# Patient Record
Sex: Female | Born: 1975
Health system: Southern US, Community
[De-identification: ages and names within clinical notes are randomized; demographics above are authoritative.]

## PROBLEM LIST (undated history)

## (undated) DIAGNOSIS — L709 Acne, unspecified: Secondary | ICD-10-CM

## (undated) DIAGNOSIS — E785 Hyperlipidemia, unspecified: Secondary | ICD-10-CM

## (undated) DIAGNOSIS — N926 Irregular menstruation, unspecified: Secondary | ICD-10-CM

## (undated) DIAGNOSIS — Z87448 Personal history of other diseases of urinary system: Secondary | ICD-10-CM

## (undated) DIAGNOSIS — Z8619 Personal history of other infectious and parasitic diseases: Secondary | ICD-10-CM

## (undated) DIAGNOSIS — N10 Acute tubulo-interstitial nephritis: Secondary | ICD-10-CM

## (undated) DIAGNOSIS — N390 Urinary tract infection, site not specified: Secondary | ICD-10-CM

## (undated) HISTORY — DX: Hyperlipidemia, unspecified: E78.5

## (undated) HISTORY — PX: EYE SURGERY: SHX253

## (undated) HISTORY — DX: Personal history of other infectious and parasitic diseases: Z86.19

## (undated) HISTORY — DX: Acute pyelonephritis: N10

## (undated) HISTORY — DX: Urinary tract infection, site not specified: N39.0

## (undated) HISTORY — DX: Personal history of other diseases of urinary system: Z87.448

## (undated) HISTORY — DX: Irregular menstruation, unspecified: N92.6

## (undated) HISTORY — DX: Acne, unspecified: L70.9

---
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
## 2009-06-10 ENCOUNTER — Ambulatory Visit: Payer: Self-pay | Admitting: Urology

## 2010-08-30 HISTORY — PX: TUBAL LIGATION: SHX77

## 2010-09-24 IMAGING — US US RENAL KIDNEY
1 series · 17 of 24 positions shown · non-contrast
Comparison: none

REASON FOR EXAM: UTI AND MICROSCOPIC HEMATURIA
COMMENTS:

[Series 1: us renal kidney · 17 of 24 slices shown]
[im 1/24]
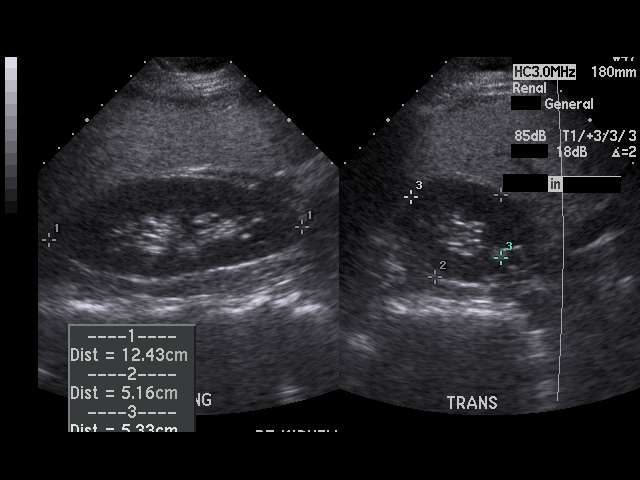
[im 3/24]
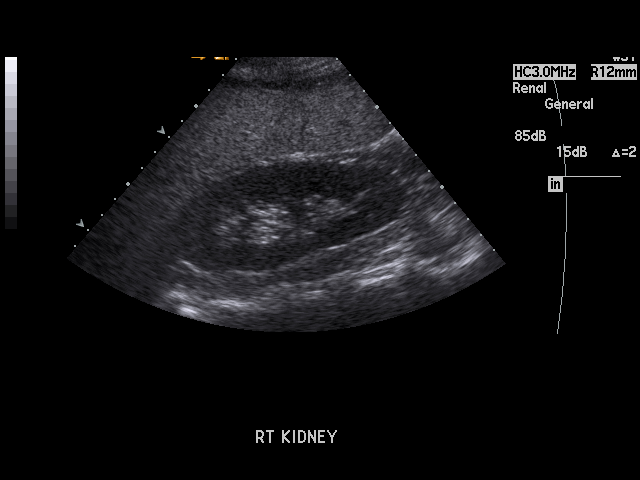
[im 4/24]
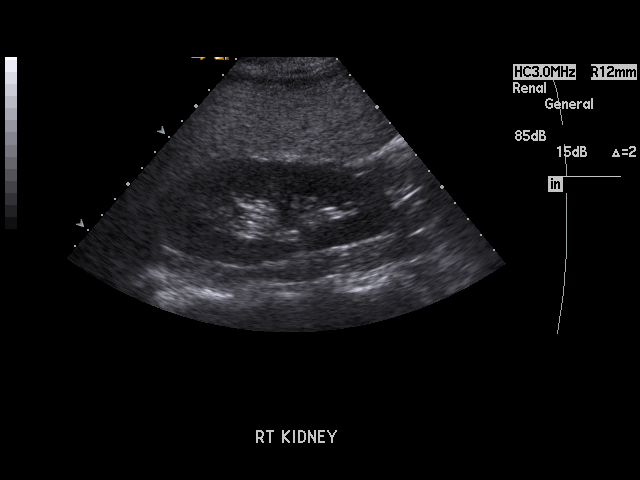
[im 5/24]
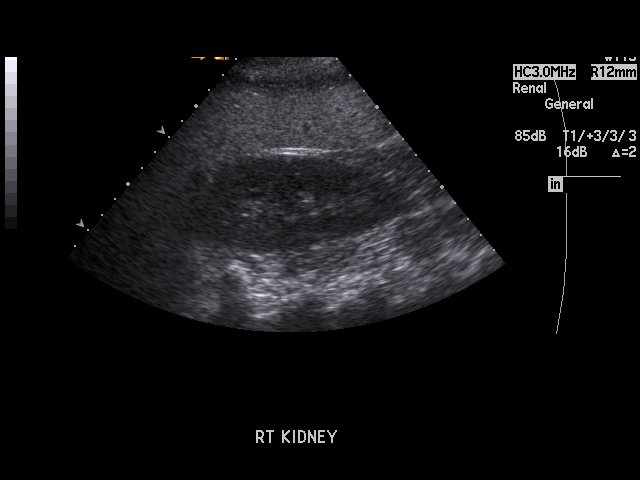
[im 7/24]
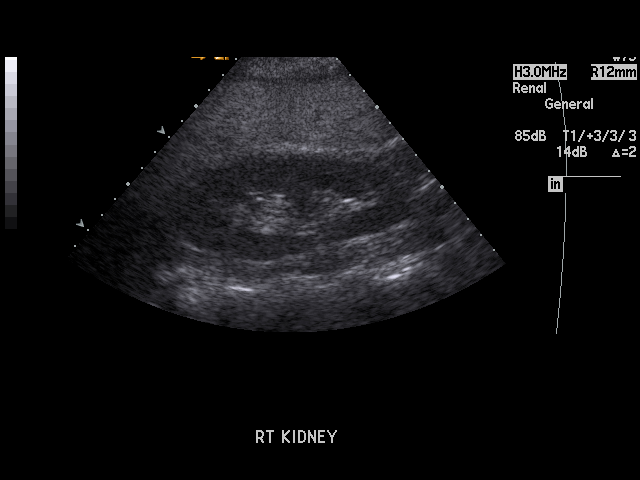
[im 8/24]
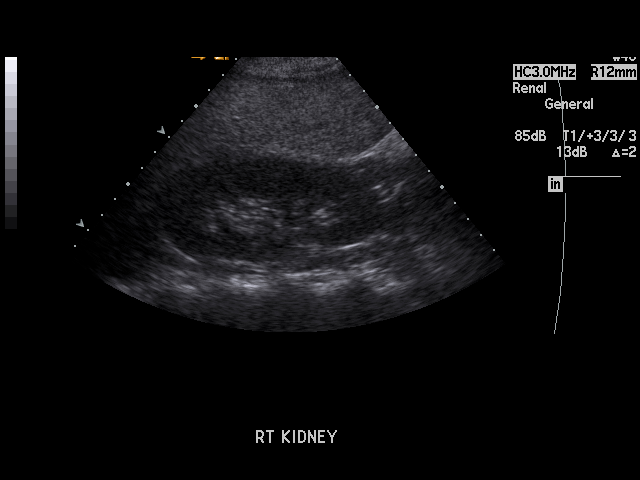
[im 10/24]
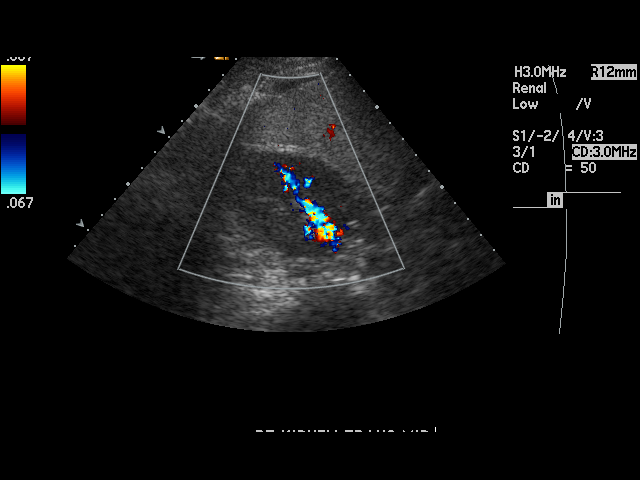
[im 11/24]
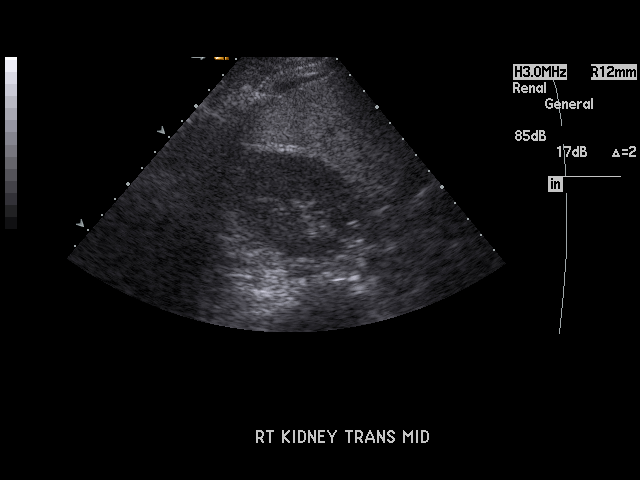
[im 13/24]
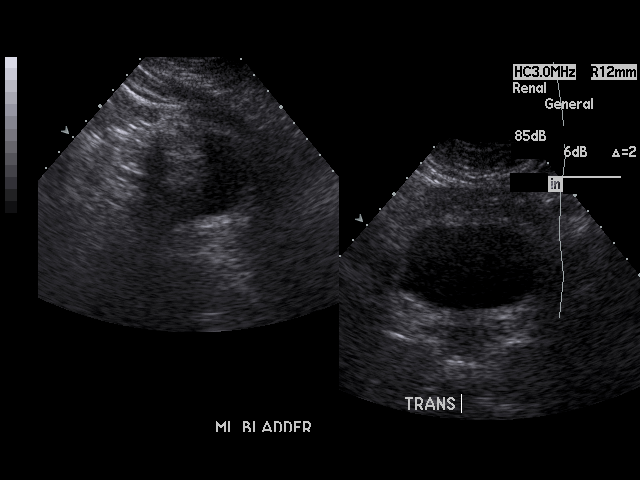
[im 14/24]
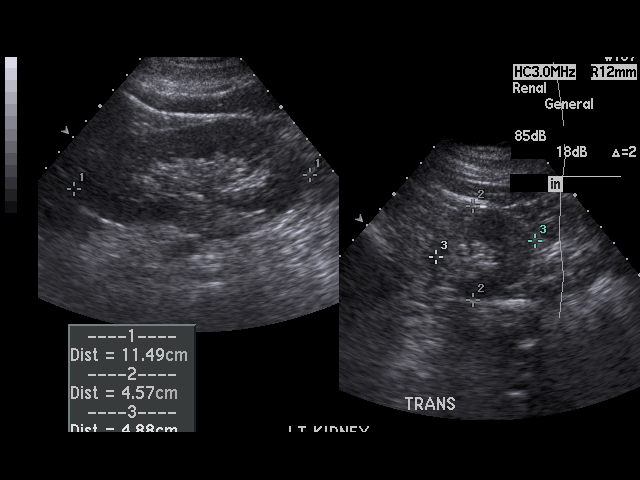
[im 15/24]
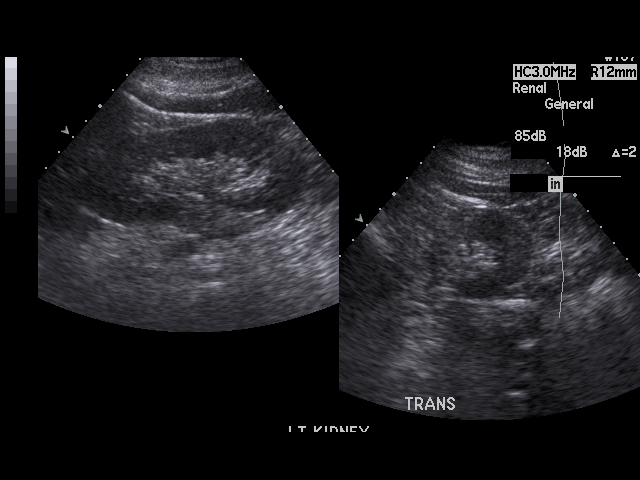
[im 17/24]
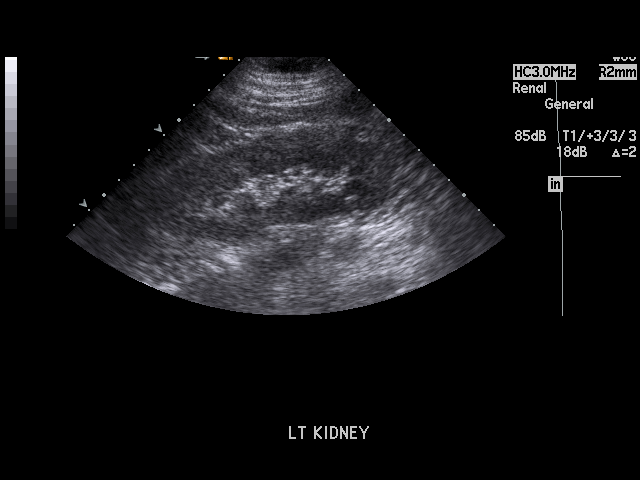
[im 18/24]
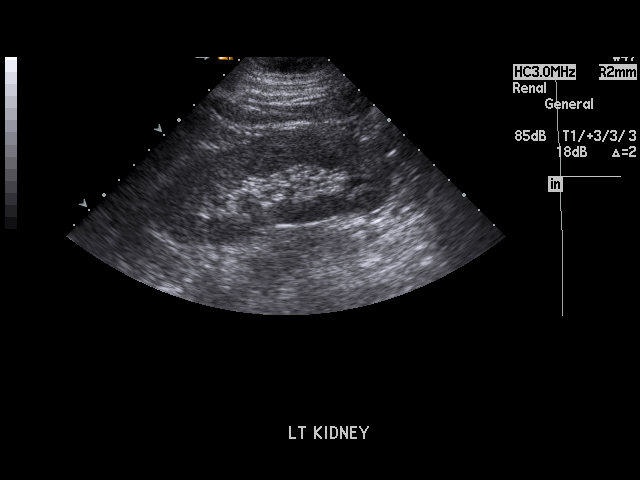
[im 20/24]
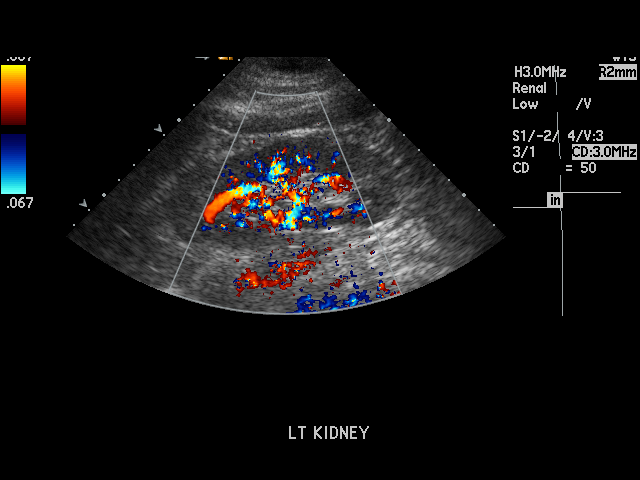
[im 21/24]
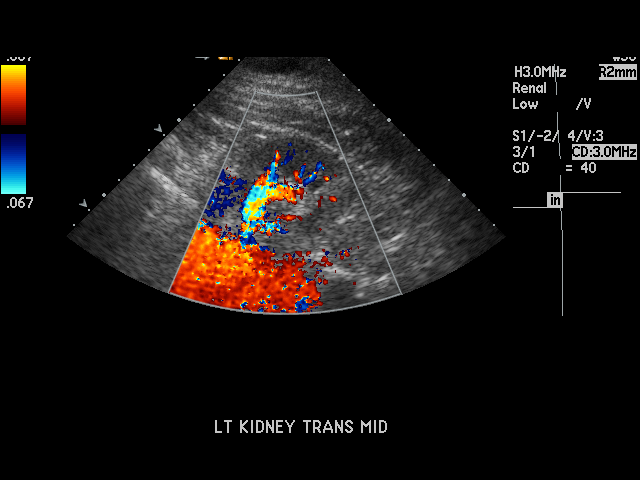
[im 22/24]
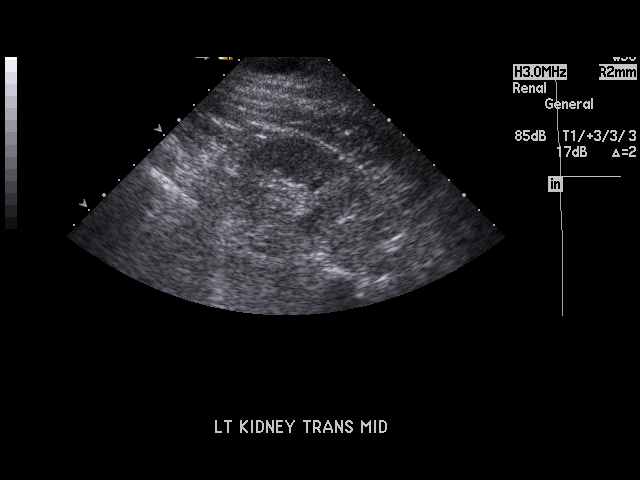
[im 24/24]
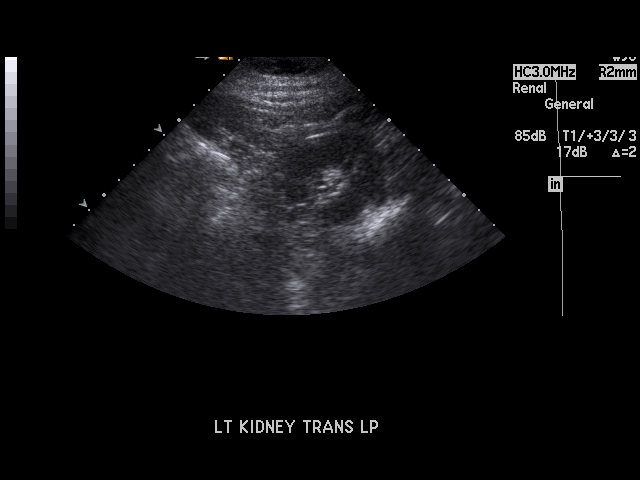

[17 of 24 positions shown; findings below may reference images not displayed]

PROCEDURE:     US  - US KIDNEY  - June 10, 2009  [DATE]

RESULT:     The right kidney measures 12.4 x 5.2 x 5.3 cm. The left kidney
measures 11.5 by 4.6 x 4.9 cm. There is no evidence of hydronephrosis nor
perinephric fluid collections. No calcified stones are identified. There is
no evidence of a cystic nor solid mass. The echotexture the renal cortex on
the right remains lower than that of the adjacent liver which is normal.
Survey views of the urinary bladder are normal.
IMPRESSION: Normal renal ultrasound examination.

## 2012-07-19 ENCOUNTER — Encounter: Payer: Self-pay | Admitting: Family Medicine

## 2012-07-19 ENCOUNTER — Ambulatory Visit (INDEPENDENT_AMBULATORY_CARE_PROVIDER_SITE_OTHER): Payer: BC Managed Care – PPO | Admitting: Family Medicine

## 2012-07-19 VITALS — BP 126/78 | HR 72 | Temp 98.2°F | Ht 63.0 in | Wt 180.5 lb

## 2012-07-19 DIAGNOSIS — L709 Acne, unspecified: Secondary | ICD-10-CM | POA: Insufficient documentation

## 2012-07-19 DIAGNOSIS — N926 Irregular menstruation, unspecified: Secondary | ICD-10-CM

## 2012-07-19 DIAGNOSIS — L708 Other acne: Secondary | ICD-10-CM

## 2012-07-19 DIAGNOSIS — Z0000 Encounter for general adult medical examination without abnormal findings: Secondary | ICD-10-CM | POA: Insufficient documentation

## 2012-07-19 DIAGNOSIS — Z23 Encounter for immunization: Secondary | ICD-10-CM

## 2012-07-19 MED ORDER — CLINDAMYCIN PHOS-BENZOYL PEROX 1-5 % EX GEL: Freq: Every day | CUTANEOUS | Status: DC

## 2012-07-19 NOTE — Patient Instructions (Addendum)
Get blood work done at [facility].  Equities trader provided today. Flu shot today. Check with OBGYN about Tdap (tetanus and pertussis) during pregnancy and let me know date so I can update your chart.  If not done, return for Tdap here. Return in 1 year or as needed.

## 2012-07-19 NOTE — Assessment & Plan Note (Signed)
Preventative protocols reviewed and updated unless pt declined. Discussed healthy diet and lifestyle. Return next year for well woman as well as CPE. Check FLP, BMP and TSH today.

## 2012-07-19 NOTE — Assessment & Plan Note (Signed)
Longstanding. Used reproductive endocrinologist for fertility last pregnancy. Check TSH today.

## 2012-07-19 NOTE — Progress Notes (Signed)
Subjective:    Patient ID: Rachel Bailey, female    DOB: [birth year], 36 y.o.   MRN: [id number]  HPI CC: new pt to establish  Has 34 yo twins.  Would like CPE today.  Dx with flu this past year.  H/o acne - has ben prescribed clindamycin and benzoyl peroxide and working well  Prior saw [facility].  LMP 11/10/2011.  Gets infrequent periods.  S/p BTL 2012.  Went to reproductive endocrinologist for latest pregnancy (twins)  CPE:  Last pap smear 03/2012 - normal. Would like labwork at [facility]. Flu shot - today. Last well woman 03/2012 with OBGYN but would like to establish here in future.  Seat belt use discussed Sunscreen use discussed.  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  Past histories reviewed and updated if relevant as below. There is no problem list on file for this patient.  Past Medical History  Diagnosis Date  . History of chicken pox    Past Surgical History  Procedure Date  . Dilation and curettage of uterus 1995    due to miscarriage  . Tubal ligation 2012   History  Substance Use Topics  . Smoking status: Never Smoker   . Smokeless tobacco: Never Used  . Alcohol Use: No   Family History  Problem Relation Age of Onset  . Hyperlipidemia Mother   . Hypertension Mother   . Hyperlipidemia Father   . Diabetes Paternal Grandmother   . Stroke Maternal Grandfather     x2  . Cancer Neg Hx   . CAD Neg Hx    Allergies  Allergen Reactions  . Ciprofloxacin Other (See Comments)    Headaches, bodyaches   No current outpatient prescriptions on file prior to visit.    Review of Systems  Constitutional: Negative for fever, chills, activity change, appetite change, fatigue and unexpected weight change.  HENT: Negative  for hearing loss and neck pain.   Eyes: Negative for visual disturbance.  Respiratory: Negative for cough, chest tightness, shortness of breath and wheezing.   Cardiovascular: Negative for chest pain, palpitations and leg swelling.  Gastrointestinal: Negative for nausea, vomiting, abdominal pain, diarrhea, constipation, blood in stool and abdominal distention.  Genitourinary: Negative for hematuria and difficulty urinating.  Musculoskeletal: Negative for myalgias and arthralgias.  Skin: Negative for rash.  Neurological: Negative for dizziness, seizures, syncope and headaches.  Hematological: Does not bruise/bleed easily.  Psychiatric/Behavioral: Negative for dysphoric mood. The patient is not nervous/anxious.        Objective:   Physical Exam  Nursing note and vitals reviewed. Constitutional: She is oriented to person, place, and time. She appears well-developed and well-nourished. No distress.  HENT:  Head: Normocephalic and atraumatic.  Right Ear: Tympanic membrane, external ear and ear canal normal.  Left Ear: Hearing, tympanic membrane, external ear and ear canal normal.  Nose: Nose normal.  Mouth/Throat: Oropharynx is clear and moist. No oropharyngeal exudate.  Eyes: Conjunctivae normal and EOM are normal. Pupils are equal, round, and reactive to light. No scleral icterus.  Neck: Normal range of motion. Neck supple. No thyromegaly present.  Cardiovascular: Normal rate, regular rhythm, normal heart sounds and intact distal pulses.   No murmur heard. Pulses:      Radial pulses are 2+ on the right side, and 2+ on the  left side.  Pulmonary/Chest: Effort normal and breath sounds normal. No respiratory distress. She has no wheezes. She has no rales.  Abdominal: Soft. Bowel sounds are normal. She exhibits no distension and no mass. There is no tenderness. There is no rebound and no guarding.  Musculoskeletal: Normal range of motion. She exhibits no edema.  Lymphadenopathy:    She has  no cervical adenopathy.  Neurological: She is alert and oriented to person, place, and time.       CN grossly intact, station and gait intact  Skin: Skin is warm and dry. No rash noted.  Psychiatric: She has a normal mood and affect. Her behavior is normal. Judgment and thought content normal.       Assessment & Plan:

## 2012-07-19 NOTE — Assessment & Plan Note (Signed)
Refilled benzaclin per pt preference as working well for her.

## 2013-07-05 ENCOUNTER — Other Ambulatory Visit: Payer: Self-pay

## 2013-10-16 ENCOUNTER — Encounter: Payer: BC Managed Care – PPO | Admitting: Family Medicine

## 2013-11-12 ENCOUNTER — Encounter: Payer: Self-pay | Admitting: Family Medicine

## 2013-11-12 ENCOUNTER — Ambulatory Visit (INDEPENDENT_AMBULATORY_CARE_PROVIDER_SITE_OTHER): Payer: BC Managed Care – PPO | Admitting: Family Medicine

## 2013-11-12 VITALS — BP 118/76 | HR 64 | Temp 98.3°F | Ht 63.0 in | Wt 183.1 lb

## 2013-11-12 DIAGNOSIS — E66811 Obesity, class 1: Secondary | ICD-10-CM | POA: Insufficient documentation

## 2013-11-12 DIAGNOSIS — Z0000 Encounter for general adult medical examination without abnormal findings: Secondary | ICD-10-CM

## 2013-11-12 DIAGNOSIS — R209 Unspecified disturbances of skin sensation: Secondary | ICD-10-CM

## 2013-11-12 DIAGNOSIS — R200 Anesthesia of skin: Secondary | ICD-10-CM | POA: Insufficient documentation

## 2013-11-12 DIAGNOSIS — Z23 Encounter for immunization: Secondary | ICD-10-CM

## 2013-11-12 DIAGNOSIS — E669 Obesity, unspecified: Secondary | ICD-10-CM

## 2013-11-12 NOTE — Assessment & Plan Note (Addendum)
Preventative protocols reviewed and updated unless pt declined. Discussed healthy diet and lifestyle. Discussed pap smear - will be due 2016 as all normal periods in past and no fmhx cervical cancer. Discussed breast exam - discussed self breast exams and reasons to seek further care.

## 2013-11-12 NOTE — Patient Instructions (Addendum)
Tdap today. Blood work Equities traderscript provided today. Good to see you today, call us with questions. Return in 1 year or as needed for next physical.

## 2013-11-12 NOTE — Assessment & Plan Note (Signed)
Discussed incorporating regular exercise into routine.

## 2013-11-12 NOTE — Assessment & Plan Note (Signed)
With intermittent L arm/neck pain, but exam reassuring today. Possible cervical neuropathy - recommended NSAID use. If red flags worsening numbness pt to contact me for further evaluation. Provided with neck stretching exercises.

## 2013-11-12 NOTE — Progress Notes (Signed)
BP 118/76  Pulse 64  Temp(Src) 98.3 F (36.8 C) (Oral)  Ht 5\' 3"  (1.6 m)  Wt 183 lb 1.9 oz (83.063 kg)  BMI 32.45 kg/m2   CC: annual exam  Subjective:    Patient ID: Rachel Bailey, female    DOB: 10/01/1975, 38 y.o.   MRN: [id number]  HPI: Rachel Bailey is a 38 y.o. female presenting on 11/12/2013 for Annual Exam   For last 6 months noticing tingling in L shoulder - starts posterior shoulder and travels anteriorly and occasionally down arm.  Occasional neck pain.  Has seen chiropractor in past which helped.  No weakness.  Has tried motrin in past which helped.  Right handed but does carry twins on left side.  Denies inciting trauma/falls.  LMP 11/10/2011. Gets infrequent periods.  Went to reproductive endocrinologist for 2012 twin pregnancy. S/p BTL 2012.   Seat belt use discussed  Sunscreen use discussed. No changing spots on skin Body mass index is 32.45 kg/(m^2). Wt Readings from Last 3 Encounters:  11/12/13 183 lb 1.9 oz (83.063 kg)  07/19/12 180 lb 8 oz (81.874 kg)    Preventative: LMP 11/2012 - normal. Always has been irregular.  Has had 3 periods since twins (2012).  Heavy bleeding for 1 week at a time.   Self breast exams at home, no concerns. Last well woman 03/2012 with OBGYN but would like to establish here in future. Flu - 06/2012. Tetanus - unsure.  Requests today. Tdap 10/2013  Caffeine: 2 cups coffee/day  Lives with husband, son (2005), 2 twins (2012), outdoor Medical laboratory scientific officercat  Occupation: Visual merchandiserDI Analyst at [employer]  Edu: BS in business administration  Activity: no regular exercise routine Diet: good water, fruits/vegetables daily   Relevant past medical, surgical, family and social history reviewed and updated as indicated.  Allergies and medications reviewed and updated. Current Outpatient Prescriptions on File Prior to Visit  Medication Sig  . clindamycin-benzoyl peroxide (BENZACLIN) gel Apply topically daily. AAA   No current facility-administered medications on file prior to  visit.    Review of Systems  Constitutional: Negative for fever, chills, activity change, appetite change, fatigue and unexpected weight change.  HENT: Negative for hearing loss.   Eyes: Negative for visual disturbance.  Respiratory: Negative for cough, chest tightness, shortness of breath and wheezing.   Cardiovascular: Negative for chest pain, palpitations and leg swelling.  Gastrointestinal: Negative for nausea, vomiting, abdominal pain, diarrhea, constipation, blood in stool and abdominal distention.  Genitourinary: Negative for hematuria and difficulty urinating. Vaginal bleeding: irregular bleed.  Musculoskeletal: Negative for arthralgias, myalgias and neck pain.  Skin: Negative for rash.  Neurological: Negative for dizziness, seizures, syncope and headaches.  Hematological: Negative for adenopathy. Does not bruise/bleed easily.  Psychiatric/Behavioral: Negative for dysphoric mood. The patient is not nervous/anxious.    Per HPI unless specifically indicated above    Objective:    BP 118/76  Pulse 64  Temp(Src) 98.3 F (36.8 C) (Oral)  Ht 5\' 3"  (1.6 m)  Wt 183 lb 1.9 oz (83.063 kg)  BMI 32.45 kg/m2  Physical Exam  Nursing note and vitals reviewed. Constitutional: She is oriented to person, place, and time. She appears well-developed and well-nourished. No distress.  HENT:  Head: Normocephalic and atraumatic.  Right Ear: Hearing, tympanic membrane, external ear and ear canal normal.  Left Ear: Hearing, tympanic membrane, external ear and ear canal normal.  Nose: Nose normal.  Mouth/Throat: Uvula is midline, oropharynx is clear and moist and mucous membranes are normal. No oropharyngeal  exudate, posterior oropharyngeal edema or posterior oropharyngeal erythema.  Eyes: Conjunctivae and EOM are normal. Pupils are equal, round, and reactive to light. No scleral icterus.  Neck: Normal range of motion. Neck supple. No thyromegaly present.  Cardiovascular: Normal rate, regular  rhythm, normal heart sounds and intact distal pulses.   No murmur heard. Pulses:      Radial pulses are 2+ on the right side, and 2+ on the left side.  Pulmonary/Chest: Effort normal and breath sounds normal. No respiratory distress. She has no wheezes. She has no rales.  Abdominal: Soft. Bowel sounds are normal. She exhibits no distension and no mass. There is no tenderness. There is no rebound and no guarding.  Genitourinary:  Deferred - due next year  Musculoskeletal: Normal range of motion. She exhibits no edema.  No midline spine tenderness or shoulder tenderness. FROM at shoulder and neck Strength intact with SITS testing against resistance  Lymphadenopathy:    She has no cervical adenopathy.  Neurological: She is alert and oriented to person, place, and time.  CN grossly intact, station and gait intact Neg spurling 5/5 strength BUE  Skin: Skin is warm and dry. No rash noted.  Psychiatric: She has a normal mood and affect. Her behavior is normal. Judgment and thought content normal.   No results found for this or any previous visit.    Assessment & Plan:   Problem List Items Addressed This Visit   Healthcare maintenance - Primary     Preventative protocols reviewed and updated unless pt declined. Discussed healthy diet and lifestyle. Discussed pap smear - will be due 2016 as all normal periods in past and no fmhx cervical cancer. Discussed breast exam - discussed self breast exams and reasons to seek further care.    Relevant Orders      Tdap vaccine greater than or equal to 7yo IM (Completed)   Left arm numbness     With intermittent L arm/neck pain, but exam reassuring today. Possible cervical neuropathy - recommended NSAID use. If red flags worsening numbness pt to contact me for further evaluation. Provided with neck stretching exercises.    Obesity     Discussed incorporating regular exercise into routine.     Other Visit Diagnoses   Immunization due         Relevant Orders       Tdap vaccine greater than or equal to 7yo IM (Completed)        Follow up plan: Return in about 4 weeks (around 12/10/2013), or as needed, for annual exam, prior fasting for blood work.

## 2013-11-12 NOTE — Progress Notes (Signed)
Pre visit review using our clinic review tool, if applicable. No additional management support is needed unless otherwise documented below in the visit note. 

## 2013-11-14 ENCOUNTER — Encounter: Payer: Self-pay | Admitting: Family Medicine

## 2013-11-15 ENCOUNTER — Encounter: Payer: Self-pay | Admitting: Family Medicine

## 2013-11-15 DIAGNOSIS — E785 Hyperlipidemia, unspecified: Secondary | ICD-10-CM | POA: Insufficient documentation

## 2013-12-03 ENCOUNTER — Encounter: Payer: Self-pay | Admitting: Family Medicine

## 2013-12-03 NOTE — Telephone Encounter (Signed)
adrienne please check on this and notify patient. It was documented and billed as a physical.

## 2014-10-08 ENCOUNTER — Telehealth: Payer: Self-pay | Admitting: Family Medicine

## 2014-10-08 NOTE — Telephone Encounter (Signed)
Patient Name: Rachel Bailey  DOB: 07/26/1976    Initial Comment Caller states is having urinating pain and fever; went to the urgent care and was given a medication and still has a fever    Nurse Assessment  Nurse: Shelva [place], RN, Marchelle FolksAmanda Date/Time (Eastern Time): 10/08/2014 3:49:59 PM  Confirm and document reason for call. If symptomatic, describe symptoms. ---Caller states is having urinating pain and fever; went to the urgent care on sunday and was given Bactrim and still has a fever 100; currently having flank pain; no pain or burning with urination now  Has the patient traveled out of the country within the last 30 days? ---Not Applicable  Does the patient require triage? ---Yes  Related visit to physician within the last 2 weeks? ---Yes  Does the PT have any chronic conditions? (i.e. diabetes, asthma, etc.) ---No  Did the patient indicate they were pregnant? ---No     Guidelines    Guideline Title Affirmed Question Affirmed Notes  Urinary Tract Infection on Antibiotic Follow-up Call - Female [1] Taking antibiotic > 24 hours for UTI (urinary tract or bladder infection) AND [2] fever persists    Final Disposition User   See Physician within 24 Hours [place], RN, Marchelle Folksmanda    Comments  Appt made for Thursday 10/10/14 at 12:00pm with Dr. Sharen HonesGutierrez

## 2014-10-08 NOTE — Telephone Encounter (Signed)
Pt already has appt on 10/10/2014 at 12 noon with Dr Reece AgarG.

## 2014-10-09 ENCOUNTER — Encounter: Payer: Self-pay | Admitting: Family Medicine

## 2014-10-09 ENCOUNTER — Ambulatory Visit (INDEPENDENT_AMBULATORY_CARE_PROVIDER_SITE_OTHER): Payer: BLUE CROSS/BLUE SHIELD | Admitting: Family Medicine

## 2014-10-09 VITALS — BP 118/90 | HR 72 | Temp 98.0°F | Wt 186.5 lb

## 2014-10-09 DIAGNOSIS — N10 Acute tubulo-interstitial nephritis: Secondary | ICD-10-CM

## 2014-10-09 HISTORY — DX: Acute pyelonephritis: N10

## 2014-10-09 MED ORDER — CEFTRIAXONE SODIUM 1 G IJ SOLR
1.0000 g | Freq: Once | INTRAMUSCULAR | Status: AC
  Administered 2014-10-09: 1 g via INTRAMUSCULAR

## 2014-10-09 NOTE — Telephone Encounter (Signed)
Appt scheduled. Had cancellation on schedule this AM.

## 2014-10-09 NOTE — Assessment & Plan Note (Signed)
Slower recovery than usual but she is noticing improvement as of today.  Will provide with CTX 1gm IM today and have patient finish bactrim course. Red flags to return discussed Encouraged increased water intake, avoiding bladder irritants. Reviewed [facility] notes and UCx.

## 2014-10-09 NOTE — Patient Instructions (Signed)
Shot of ceftriaxone today 1gm IM . Finish bactrim course Push fluids and rest.  Pyelonephritis, Adult Pyelonephritis is a kidney infection. In general, there are 2 main types of pyelonephritis:  Infections that come on quickly without any warning (acute pyelonephritis).  Infections that persist for a long period of time (chronic pyelonephritis). CAUSES  Two main causes of pyelonephritis are:  Bacteria traveling from the bladder to the kidney. This is a problem especially in pregnant women. The urine in the bladder can become filled with bacteria from multiple causes, including:  Inflammation of the prostate gland (prostatitis).  Sexual intercourse in females.  Bladder infection (cystitis).  Bacteria traveling from the bloodstream to the tissue part of the kidney. Problems that may increase your risk of getting a kidney infection include:  Diabetes.  Kidney stones or bladder stones.  Cancer.  Catheters placed in the bladder.  Other abnormalities of the kidney or ureter. SYMPTOMS   Abdominal pain.  Pain in the side or flank area.  Fever.  Chills.  Upset stomach.  Blood in the urine (dark urine).  Frequent urination.  Strong or persistent urge to urinate.  Burning or stinging when urinating. DIAGNOSIS  Your caregiver may diagnose your kidney infection based on your symptoms. A urine sample may also be taken. TREATMENT  In general, treatment depends on how severe the infection is.   If the infection is mild and caught early, your caregiver may treat you with oral antibiotics and send you home.  If the infection is more severe, the bacteria may have gotten into the bloodstream. This will require intravenous (IV) antibiotics and a hospital stay. Symptoms may include:  High fever.  Severe flank pain.  Shaking chills.  Even after a hospital stay, your caregiver may require you to be on oral antibiotics for a period of time.  Other treatments may be  required depending upon the cause of the infection. HOME CARE INSTRUCTIONS   Take your antibiotics as directed. Finish them even if you start to feel better.  Make an appointment to have your urine checked to make sure the infection is gone.  Drink enough fluids to keep your urine clear or pale yellow.  Take medicines for the bladder if you have urgency and frequency of urination as directed by your caregiver. SEEK IMMEDIATE MEDICAL CARE IF:   You have a fever or persistent symptoms for more than 2-3 days.  You have a fever and your symptoms suddenly get worse.  You are unable to take your antibiotics or fluids.  You develop shaking chills.  You experience extreme weakness or fainting.  There is no improvement after 2 days of treatment. MAKE SURE YOU:  Understand these instructions.  Will watch your condition.  Will get help right away if you are not doing well or get worse. Document Released: 08/16/2005 Document Revised: 02/15/2012 Document Reviewed: 01/20/2011 [facility] Patient Information 2015 [place]. This information is not intended to replace advice given to you by your health care provider. Make sure you discuss any questions you have with your health care provider.

## 2014-10-09 NOTE — Telephone Encounter (Signed)
Message left for patient to return my call.  

## 2014-10-09 NOTE — Progress Notes (Signed)
   BP 118/90 mmHg  Pulse 72  Temp(Src) 98 F (36.7 C) (Oral)  Wt 186 lb 8 oz (84.596 kg)  LMP 05/30/2014   CC: f/u UTI  Subjective:    Patient ID: Rachel Bailey Strehle, female    DOB: 12/21/1975, 39 y.o.   MRN: [id number]  HPI: Rachel Bailey Monds is a 39 y.o. female presenting on 10/09/2014 for Urinary Tract Infection   [facility] dx UTI with fever to 101.9 at clinic, started on bactrim DS bid x 2 weeks. UCx [facility] reviewed - >100k Ecoli sensitive to bactrim and other abx, resistant to ampicillin, cefazolin not tested.   sxs included fever, change in urine smell, side throbbing and lower back pain.  No dysuria, urgency, hematuria, frequency, nausea/vomiting.  H/o recurrent UTIs with pyelo when younger. 8-12 infections in the past. None in last year.   Yesterday with persistent fever and body aches. Tmax 100 yesterday. Has been treating with motrin. No tylenol.  This time had slower recovery than usual.  Allergy: ciprofloxacin.  No results found for: CREATININE  Relevant past medical, surgical, family and social history reviewed and updated as indicated. Interim medical history since our last visit reviewed. Allergies and medications reviewed and updated. No current outpatient prescriptions on file prior to visit.   No current facility-administered medications on file prior to visit.    Review of Systems Per HPI unless specifically indicated above     Objective:    BP 118/90 mmHg  Pulse 72  Temp(Src) 98 F (36.7 C) (Oral)  Wt 186 lb 8 oz (84.596 kg)  LMP 05/30/2014  Wt Readings from Last 3 Encounters:  10/09/14 186 lb 8 oz (84.596 kg)  11/12/13 183 lb 1.9 oz (83.063 kg)  07/19/12 180 lb 8 oz (81.874 kg)    Physical Exam  Constitutional: She appears well-developed and well-nourished. No distress.  HENT:  Mouth/Throat: Oropharynx is clear and moist. No oropharyngeal exudate.  Cardiovascular: Normal rate, regular rhythm, normal heart sounds and  intact distal pulses.   No murmur heard. Pulmonary/Chest: Effort normal and breath sounds normal. No respiratory distress. She has no wheezes. She has no rales.  Abdominal: Soft. Normal appearance and bowel sounds are normal. She exhibits no distension and no mass. There is no hepatosplenomegaly. There is tenderness (mild L flank). There is no rigidity, no rebound, no guarding, no CVA tenderness and negative Murphy's sign.  Musculoskeletal: She exhibits no edema.  Skin: Skin is warm and dry. No rash noted.  Psychiatric: She has a normal mood and affect.  Nursing note and vitals reviewed.  No results found for this or any previous visit.    Assessment & Plan:   Problem List Items Addressed This Visit    Acute pyelonephritis - Primary    Slower recovery than usual but she is noticing improvement as of today.  Will provide with CTX 1gm IM today and have patient finish bactrim course. Red flags to return discussed Encouraged increased water intake, avoiding bladder irritants. Reviewed UCC notes and UCx.      Relevant Medications   sulfamethoxazole-trimethoprim (BACTRIM DS,SEPTRA DS) 800-160 MG per tablet       Follow up plan: Return if symptoms worsen or fail to improve.

## 2014-10-09 NOTE — Telephone Encounter (Signed)
Can we schedule patient Wednesday at 12:30pm?

## 2014-10-09 NOTE — Progress Notes (Signed)
Pre visit review using our clinic review tool, if applicable. No additional management support is needed unless otherwise documented below in the visit note. 

## 2014-10-09 NOTE — Addendum Note (Signed)
Addended by: Josph MachoANCE, KIMBERLY A on: 10/09/2014 01:11 PM   Modules accepted: Orders

## 2014-10-10 ENCOUNTER — Ambulatory Visit: Payer: Self-pay | Admitting: Family Medicine

## 2015-02-19 ENCOUNTER — Encounter: Payer: Self-pay | Admitting: Unknown Physician Specialty

## 2015-02-19 ENCOUNTER — Ambulatory Visit (INDEPENDENT_AMBULATORY_CARE_PROVIDER_SITE_OTHER): Payer: BLUE CROSS/BLUE SHIELD | Admitting: Unknown Physician Specialty

## 2015-02-19 VITALS — BP 126/94 | HR 73 | Temp 98.3°F | Ht 65.3 in | Wt 188.8 lb

## 2015-02-19 DIAGNOSIS — R300 Dysuria: Secondary | ICD-10-CM | POA: Diagnosis not present

## 2015-02-19 DIAGNOSIS — L298 Other pruritus: Secondary | ICD-10-CM

## 2015-02-19 DIAGNOSIS — N898 Other specified noninflammatory disorders of vagina: Secondary | ICD-10-CM

## 2015-02-19 DIAGNOSIS — E282 Polycystic ovarian syndrome: Secondary | ICD-10-CM

## 2015-02-19 HISTORY — DX: Polycystic ovarian syndrome: E28.2

## 2015-02-19 LAB — BAYER DCA HB A1C WAIVED: HB A1C: 5.6 % (ref <7.0)

## 2015-02-19 LAB — UA/M W/RFLX CULTURE, ROUTINE
Bilirubin, UA: NEGATIVE
Glucose, UA: NEGATIVE
Ketones, UA: NEGATIVE
LEUKOCYTES UA: NEGATIVE
NITRITE UA: NEGATIVE
PH UA: 6 (ref 5.0–7.5)
SPEC GRAV UA: 1.025 (ref 1.005–1.030)
Urobilinogen, Ur: 0.2 mg/dL (ref 0.2–1.0)

## 2015-02-19 LAB — MICROSCOPIC EXAMINATION

## 2015-02-19 LAB — WET PREP FOR TRICH, YEAST, CLUE
CLUE CELL EXAM: NEGATIVE
TRICHOMONAS EXAM: NEGATIVE
YEAST EXAM: POSITIVE — AB

## 2015-02-19 LAB — GLUCOSE HEMOCUE WAIVED: Glu Hemocue Waived: 83 mg/dL (ref 65–99)

## 2015-02-19 MED ORDER — FLUCONAZOLE 150 MG PO TABS: 150.0000 mg | ORAL_TABLET | Freq: Once | ORAL | Status: DC

## 2015-02-19 NOTE — Patient Instructions (Signed)

## 2015-02-19 NOTE — Progress Notes (Signed)
BP 126/94 mmHg  Pulse 73  Temp(Src) 98.3 F (36.8 C)  Ht 5' 5.3" (1.659 m)  Wt 188 lb 12.8 oz (85.639 kg)  BMI 31.12 kg/m2  SpO2 99%  LMP  (LMP Unknown)   Subjective:    Patient ID: Rachel Bailey, female    DOB: Jan 05, 1976, 39 y.o.   MRN: [id number]  HPI: Rachel Bailey is a 39 y.o. female  Chief Complaint  Patient presents with  . Vaginal Itching    pt states when she had the itching, it was also burning and caused urinary urgency   Vaginal Itching The patient's primary symptoms include genital itching. This is a new problem. The current episode started in the past 7 days. The problem occurs constantly. The problem has been gradually improving. The patient is experiencing no pain. The problem affects both sides. She is not pregnant. Associated symptoms include urgency. Nothing aggravates the symptoms. She has tried antifungals for the symptoms. The treatment provided moderate relief. No, her partner does not have an STD. Her menstrual history has been irregular.     Relevant past medical, surgical, family and social history reviewed and updated as indicated. Interim medical history since our last visit reviewed. Allergies and medications reviewed and updated.  Review of Systems  Genitourinary: Positive for urgency.    Per HPI unless specifically indicated above     Objective:    BP 126/94 mmHg  Pulse 73  Temp(Src) 98.3 F (36.8 C)  Ht 5' 5.3" (1.659 m)  Wt 188 lb 12.8 oz (85.639 kg)  BMI 31.12 kg/m2  SpO2 99%  LMP  (LMP Unknown)  Wt Readings from Last 3 Encounters:  02/19/15 188 lb 12.8 oz (85.639 kg)  10/09/14 186 lb 8 oz (84.596 kg)  11/12/13 183 lb 1.9 oz (83.063 kg)    Physical Exam  Constitutional: She is oriented to person, place, and time. She appears well-developed and well-nourished. No distress.  HENT:  Head: Normocephalic and atraumatic.  Eyes: Conjunctivae and lids are normal. Right eye exhibits no discharge. Left eye exhibits no discharge. No scleral  icterus.  Cardiovascular: Normal rate and regular rhythm.   Pulmonary/Chest: Effort normal. No respiratory distress.  Abdominal: Normal appearance. There is no splenomegaly or hepatomegaly.  Genitourinary: There is no rash, tenderness or lesion on the right labia. There is no rash, tenderness or lesion on the left labia. Cervix exhibits no motion tenderness, no discharge and no friability. No tenderness in the vagina. Vaginal discharge found.  Musculoskeletal: Normal range of motion.  Neurological: She is alert and oriented to person, place, and time.  Skin: Skin is intact. No rash noted. No pallor.  Psychiatric: She has a normal mood and affect. Her behavior is normal. Judgment and thought content normal.    No results found for this or any previous visit.    Assessment & Plan:   Problem List Items Addressed This Visit      Endocrine   PCOS (polycystic ovarian syndrome)    Glucose 83 and Hgb A1C is 5.6       Other Visit Diagnoses    Dysuria    -  Primary    Urine is negative    Relevant Orders    UA/M w/rflx Culture, Routine    Vaginal itching        Wet prep positive for yeast    Relevant Orders    WET PREP FOR TRICH, YEAST, CLUE    Glucose Hemocue Waived    Bayer DCA Hb  A1c Waived        Follow up plan: Return if symptoms worsen or fail to improve.

## 2015-02-19 NOTE — Assessment & Plan Note (Signed)
Glucose 83 and Hgb A1C is 5.6

## 2015-02-27 ENCOUNTER — Other Ambulatory Visit: Payer: Self-pay | Admitting: Unknown Physician Specialty

## 2015-02-27 ENCOUNTER — Encounter: Payer: Self-pay | Admitting: Unknown Physician Specialty

## 2015-02-27 MED ORDER — HYDROCORTISONE 2.5 % RE CREA: 1.0000 "application " | TOPICAL_CREAM | Freq: Two times a day (BID) | RECTAL | Status: DC

## 2015-02-27 MED ORDER — FLUCONAZOLE 150 MG PO TABS: 150.0000 mg | ORAL_TABLET | Freq: Once | ORAL | Status: DC

## 2015-07-11 ENCOUNTER — Encounter: Payer: Self-pay | Admitting: Family Medicine

## 2015-07-11 ENCOUNTER — Ambulatory Visit (INDEPENDENT_AMBULATORY_CARE_PROVIDER_SITE_OTHER): Payer: BLUE CROSS/BLUE SHIELD | Admitting: Family Medicine

## 2015-07-11 ENCOUNTER — Other Ambulatory Visit (HOSPITAL_COMMUNITY)
Admission: RE | Admit: 2015-07-11 | Discharge: 2015-07-11 | Disposition: A | Discharge status: Home / Self Care | Payer: BLUE CROSS/BLUE SHIELD | Source: Ambulatory Visit | Attending: Family Medicine | Admitting: Family Medicine

## 2015-07-11 VITALS — BP 118/72 | HR 68 | Temp 98.1°F | Ht 65.0 in | Wt 189.0 lb

## 2015-07-11 DIAGNOSIS — Z0000 Encounter for general adult medical examination without abnormal findings: Secondary | ICD-10-CM | POA: Diagnosis not present

## 2015-07-11 DIAGNOSIS — Z1151 Encounter for screening for human papillomavirus (HPV): Secondary | ICD-10-CM | POA: Diagnosis present

## 2015-07-11 DIAGNOSIS — E282 Polycystic ovarian syndrome: Secondary | ICD-10-CM

## 2015-07-11 DIAGNOSIS — Z01419 Encounter for gynecological examination (general) (routine) without abnormal findings: Secondary | ICD-10-CM | POA: Diagnosis present

## 2015-07-11 DIAGNOSIS — E669 Obesity, unspecified: Secondary | ICD-10-CM

## 2015-07-11 DIAGNOSIS — E785 Hyperlipidemia, unspecified: Secondary | ICD-10-CM

## 2015-07-11 LAB — LIPID PANEL
Cholesterol: 191 mg/dL (ref 0–200)
HDL: 37.1 mg/dL — ABNORMAL LOW (ref >39.00)
NONHDL: 154.24
TRIGLYCERIDES: 268 mg/dL — AB (ref 0.0–149.0)
Total CHOL/HDL Ratio: 5
VLDL: 53.6 mg/dL — ABNORMAL HIGH (ref 0.0–40.0)

## 2015-07-11 LAB — LDL CHOLESTEROL, DIRECT: Direct LDL: 128 mg/dL

## 2015-07-11 NOTE — Assessment & Plan Note (Signed)
Reviewed with patient. Check FLP today.

## 2015-07-11 NOTE — Assessment & Plan Note (Signed)
Discussed healthy diet and exercise changes to affect sustainable weight loss

## 2015-07-11 NOTE — Addendum Note (Signed)
Addended by: Cleda ClarksIPLEY, Aylen Rambert B on: 07/11/2015 08:49 AM   Modules accepted: Orders, SmartSet

## 2015-07-11 NOTE — Assessment & Plan Note (Signed)
Preventative protocols reviewed and updated unless pt declined. Discussed healthy diet and lifestyle.  

## 2015-07-11 NOTE — Assessment & Plan Note (Signed)
S/p BTL. Persistent irregular periods. Recent sugar/A1c normal.

## 2015-07-11 NOTE — Progress Notes (Signed)
BP 118/72 mmHg  Pulse 68  Temp(Src) 98.1 F (36.7 C) (Oral)  Ht  (1.651 m)  Wt 189 lb (85.73 kg)  BMI 31.45 kg/m2  SpO2 97%   CC: CPE  Subjective:    Patient ID: Rachel Bailey, female    DOB: 28-Mar-1976, 39 y.o.   MRN: [id number]  HPI: Rachel Bailey is a 39 y.o. female presenting on 07/11/2015 for Annual Exam   Takes juice plus supplements. Recent possible food poisoning x24 hours then sxs resolved.   LMP 03/2015. Gets infrequent periods due to PCOS.  Went to reproductive endocrinologist for 2012 twin pregnancy. S/p BTL 2012.   Preventative: LMP 03/2015 - normal. Always has been irregular.  Last well woman 03/2012 with OBGYN but would like to establish here in future. No breast exams at home.  Flu - declines Tetanus - 10/2013 Seat belt use discussed Sunscreen use discussed. No changing moles on skin.  Caffeine: 2 cups coffee/day  Lives with husband, son (2005), 2 twins (2012), outdoor Medical laboratory scientific officer  Occupation: Tourist information centre manager at [employer] - [employer]  Edu: BS in business administration  Activity: no regular exercise routine  Diet: good water, fruits/vegetables daily   Relevant past medical, surgical, family and social history reviewed and updated as indicated. Interim medical history since our last visit reviewed. Allergies and medications reviewed and updated. No current outpatient prescriptions on file prior to visit.   No current facility-administered medications on file prior to visit.    Review of Systems  Constitutional: Negative for fever, chills, activity change, appetite change, fatigue and unexpected weight change.  HENT: Negative for hearing loss.   Eyes: Negative for visual disturbance (recent lasik).  Respiratory: Negative for cough, chest tightness, shortness of breath and wheezing.   Cardiovascular: Negative for chest pain, palpitations and leg swelling.  Gastrointestinal: Positive for vomiting. Negative for nausea, abdominal pain, diarrhea, constipation,  blood in stool and abdominal distention.  Genitourinary: Negative for hematuria and difficulty urinating.  Musculoskeletal: Negative for myalgias, arthralgias and neck pain.  Skin: Negative for rash.  Neurological: Positive for dizziness. Negative for seizures, syncope and headaches.  Hematological: Negative for adenopathy. Does not bruise/bleed easily.  Psychiatric/Behavioral: Negative for dysphoric mood. The patient is not nervous/anxious.    Per HPI unless specifically indicated in ROS section     Objective:    BP 118/72 mmHg  Pulse 68  Temp(Src) 98.1 F (36.7 C) (Oral)  Ht  (1.651 m)  Wt 189 lb (85.73 kg)  BMI 31.45 kg/m2  SpO2 97%  Wt Readings from Last 3 Encounters:  07/11/15 189 lb (85.73 kg)  02/19/15 188 lb 12.8 oz (85.639 kg)  10/09/14 186 lb 8 oz (84.596 kg)    Physical Exam  Constitutional: She is oriented to person, place, and time. She appears well-developed and well-nourished. No distress.  HENT:  Head: Normocephalic and atraumatic.  Right Ear: Hearing, tympanic membrane, external ear and ear canal normal.  Left Ear: Hearing, tympanic membrane, external ear and ear canal normal.  Nose: Nose normal.  Mouth/Throat: Uvula is midline, oropharynx is clear and moist and mucous membranes are normal. No oropharyngeal exudate, posterior oropharyngeal edema or posterior oropharyngeal erythema.  Eyes: Conjunctivae and EOM are normal. Pupils are equal, round, and reactive to light. No scleral icterus.  Neck: Normal range of motion. Neck supple. No thyromegaly present.  Cardiovascular: Normal rate, regular rhythm, normal heart sounds and intact distal pulses.   No murmur heard. Pulses:      Radial  pulses are 2+ on the right side, and 2+ on the left side.  Pulmonary/Chest: Effort normal and breath sounds normal. No respiratory distress. She has no wheezes. She has no rales. Right breast exhibits no inverted nipple, no mass, no nipple discharge, no skin change and no  tenderness. Left breast exhibits no inverted nipple, no mass, no nipple discharge, no skin change and no tenderness.  Abdominal: Soft. Bowel sounds are normal. She exhibits no distension and no mass. There is no tenderness. There is no rebound and no guarding.  Genitourinary: Vagina normal and uterus normal. Pelvic exam was performed with patient supine. There is no rash, tenderness or lesion on the right labia. There is no rash, tenderness or lesion on the left labia. Cervix exhibits no motion tenderness and no discharge. Right adnexum displays no mass, no tenderness and no fullness. Left adnexum displays no mass, no tenderness and no fullness.  Musculoskeletal: Normal range of motion. She exhibits no edema.  Lymphadenopathy:       Head (right side): No submental, no submandibular, no tonsillar, no preauricular and no posterior auricular adenopathy present.       Head (left side): No submental, no submandibular, no tonsillar, no preauricular and no posterior auricular adenopathy present.    She has no cervical adenopathy.    She has no axillary adenopathy.       Right axillary: No lateral adenopathy present.       Left axillary: No lateral adenopathy present.      Right: No supraclavicular adenopathy present.       Left: No supraclavicular adenopathy present.  Neurological: She is alert and oriented to person, place, and time.  CN grossly intact, station and gait intact  Skin: Skin is warm and dry. No rash noted.  Psychiatric: She has a normal mood and affect. Her behavior is normal. Judgment and thought content normal.  Nursing note and vitals reviewed.      Assessment & Plan:   Problem List Items Addressed This Visit    PCOS (polycystic ovarian syndrome)    S/p BTL. Persistent irregular periods. Recent sugar/A1c normal.      Obesity, Class I, BMI 30-34.9    Discussed healthy diet and exercise changes to affect sustainable weight loss      Healthcare maintenance - Primary     Preventative protocols reviewed and updated unless pt declined. Discussed healthy diet and lifestyle.       Dyslipidemia    Reviewed with patient. Check FLP today.      Relevant Orders   Lipid panel       Follow up plan: Return in about 1 year (around 07/10/2016), or as needed, for annual exam, prior fasting for blood work.

## 2015-07-11 NOTE — Patient Instructions (Addendum)
Blood work today for cholesterol levels - your triglycerides were too high last time. Decrease added sugars, eliminate trans fats, increase fiber and limit alcohol.  All these changes together can drop triglycerides by almost 50%.. Nice to see you today. Incorporate exercise into regimen.  Health Maintenance, Female Adopting a healthy lifestyle and getting preventive care can go a long way to promote health and wellness. Talk with your health care provider about what schedule of regular examinations is right for you. This is a good chance for you to check in with your provider about disease prevention and staying healthy. In between checkups, there are plenty of things you can do on your own. Experts have done a lot of research about which lifestyle changes and preventive measures are most likely to keep you healthy. Ask your health care provider for more information. WEIGHT AND DIET  Eat a healthy diet  Be sure to include plenty of vegetables, fruits, low-fat dairy products, and lean protein.  Do not eat a lot of foods high in solid fats, added sugars, or salt.  Get regular exercise. This is one of the most important things you can do for your health.  Most adults should exercise for at least 150 minutes each week. The exercise should increase your heart rate and make you sweat (moderate-intensity exercise).  Most adults should also do strengthening exercises at least twice a week. This is in addition to the moderate-intensity exercise.  Maintain a healthy weight  Body mass index (BMI) is a measurement that can be used to identify possible weight problems. It estimates body fat based on height and weight. Your health care provider can help determine your BMI and help you achieve or maintain a healthy weight.  For females 43 years of age and older:   A BMI below 18.5 is considered underweight.  A BMI of 18.5 to 24.9 is normal.  A BMI of 25 to 29.9 is considered overweight.  A BMI of  30 and above is considered obese.  Watch levels of cholesterol and blood lipids  You should start having your blood tested for lipids and cholesterol at 37 years of age, then have this test every 5 years.  You may need to have your cholesterol levels checked more often if:  Your lipid or cholesterol levels are high.  You are older than 37 years of age.  You are at high risk for heart disease.  CANCER SCREENING   Lung Cancer  Lung cancer screening is recommended for adults 48-71 years old who are at high risk for lung cancer because of a history of smoking.  A yearly low-dose CT scan of the lungs is recommended for people who:  Currently smoke.  Have quit within the past 15 years.  Have at least a 30-pack-year history of smoking. A pack year is smoking an average of one pack of cigarettes a day for 1 year.  Yearly screening should continue until it has been 15 years since you quit.  Yearly screening should stop if you develop a health problem that would prevent you from having lung cancer treatment.  Breast Cancer  Practice breast self-awareness. This means understanding how your breasts normally appear and feel.  It also means doing regular breast self-exams. Let your health care provider know about any changes, no matter how small.  If you are in your 20s or 30s, you should have a clinical breast exam (CBE) by a health care provider every 1-3 years as part of a  regular health exam.  If you are 40 or older, have a CBE every year. Also consider having a breast X-ray (mammogram) every year.  If you have a family history of breast cancer, talk to your health care provider about genetic screening.  If you are at high risk for breast cancer, talk to your health care provider about having an MRI and a mammogram every year.  Breast cancer gene (BRCA) assessment is recommended for women who have family members with BRCA-related cancers. BRCA-related cancers  include:  Breast.  Ovarian.  Tubal.  Peritoneal cancers.  Results of the assessment will determine the need for genetic counseling and BRCA1 and BRCA2 testing. Cervical Cancer Your health care provider may recommend that you be screened regularly for cancer of the pelvic organs (ovaries, uterus, and vagina). This screening involves a pelvic examination, including checking for microscopic changes to the surface of your cervix (Pap test). You may be encouraged to have this screening done every 3 years, beginning at age 21.  For women ages 30-65, health care providers may recommend pelvic exams and Pap testing every 3 years, or they may recommend the Pap and pelvic exam, combined with testing for human papilloma virus (HPV), every 5 years. Some types of HPV increase your risk of cervical cancer. Testing for HPV may also be done on women of any age with unclear Pap test results.  Other health care providers may not recommend any screening for nonpregnant women who are considered low risk for pelvic cancer and who do not have symptoms. Ask your health care provider if a screening pelvic exam is right for you.  If you have had past treatment for cervical cancer or a condition that could lead to cancer, you need Pap tests and screening for cancer for at least 20 years after your treatment. If Pap tests have been discontinued, your risk factors (such as having a new sexual partner) need to be reassessed to determine if screening should resume. Some women have medical problems that increase the chance of getting cervical cancer. In these cases, your health care provider may recommend more frequent screening and Pap tests. Colorectal Cancer  This type of cancer can be detected and often prevented.  Routine colorectal cancer screening usually begins at 37 years of age and continues through 37 years of age.  Your health care provider may recommend screening at an earlier age if you have risk factors for  colon cancer.  Your health care provider may also recommend using home test kits to check for hidden blood in the stool.  A small camera at the end of a tube can be used to examine your colon directly (sigmoidoscopy or colonoscopy). This is done to check for the earliest forms of colorectal cancer.  Routine screening usually begins at age 50.  Direct examination of the colon should be repeated every 5-10 years through 37 years of age. However, you may need to be screened more often if early forms of precancerous polyps or small growths are found. Skin Cancer  Check your skin from head to toe regularly.  Tell your health care provider about any new moles or changes in moles, especially if there is a change in a mole's shape or color.  Also tell your health care provider if you have a mole that is larger than the size of a pencil eraser.  Always use sunscreen. Apply sunscreen liberally and repeatedly throughout the day.  Protect yourself by wearing long sleeves, pants, a wide-brimmed hat,   and sunglasses whenever you are outside. HEART DISEASE, DIABETES, AND HIGH BLOOD PRESSURE   High blood pressure causes heart disease and increases the risk of stroke. High blood pressure is more likely to develop in:  People who have blood pressure in the high end of the normal range (130-139/85-89 mm Hg).  People who are overweight or obese.  People who are African American.  If you are 73-98 years of age, have your blood pressure checked every 3-5 years. If you are 1 years of age or older, have your blood pressure checked every year. You should have your blood pressure measured twice--once when you are at a hospital or clinic, and once when you are not at a hospital or clinic. Record the average of the two measurements. To check your blood pressure when you are not at a hospital or clinic, you can use:  An automated blood pressure machine at a pharmacy.  A home blood pressure monitor.  If you  are between 55 years and 40 years old, ask your health care provider if you should take aspirin to prevent strokes.  Have regular diabetes screenings. This involves taking a blood sample to check your fasting blood sugar level.  If you are at a normal weight and have a low risk for diabetes, have this test once every three years after 37 years of age.  If you are overweight and have a high risk for diabetes, consider being tested at a younger age or more often. PREVENTING INFECTION  Hepatitis B  If you have a higher risk for hepatitis B, you should be screened for this virus. You are considered at high risk for hepatitis B if:  You were born in a country where hepatitis B is common. Ask your health care provider which countries are considered high risk.  Your parents were born in a high-risk country, and you have not been immunized against hepatitis B (hepatitis B vaccine).  You have HIV or AIDS.  You use needles to inject street drugs.  You live with someone who has hepatitis B.  You have had sex with someone who has hepatitis B.  You get hemodialysis treatment.  You take certain medicines for conditions, including cancer, organ transplantation, and autoimmune conditions. Hepatitis C  Blood testing is recommended for:  Everyone born from [birth year] through 1963.  Anyone with known risk factors for hepatitis C. Sexually transmitted infections (STIs)  You should be screened for sexually transmitted infections (STIs) including gonorrhea and chlamydia if:  You are sexually active and are younger than 37 years of age.  You are older than 37 years of age and your health care provider tells you that you are at risk for this type of infection.  Your sexual activity has changed since you were last screened and you are at an increased risk for chlamydia or gonorrhea. Ask your health care provider if you are at risk.  If you do not have HIV, but are at risk, it may be recommended that you  take a prescription medicine daily to prevent HIV infection. This is called pre-exposure prophylaxis (PrEP). You are considered at risk if:  You are sexually active and do not regularly use condoms or know the HIV status of your partner(s).  You take drugs by injection.  You are sexually active with a partner who has HIV. Talk with your health care provider about whether you are at high risk of being infected with HIV. If you choose to begin PrEP, you should  first be tested for HIV. You should then be tested every 3 months for as long as you are taking PrEP.  PREGNANCY   If you are premenopausal and you may become pregnant, ask your health care provider about preconception counseling.  If you may become pregnant, take 400 to 800 micrograms (mcg) of folic acid every day.  If you want to prevent pregnancy, talk to your health care provider about birth control (contraception). OSTEOPOROSIS AND MENOPAUSE   Osteoporosis is a disease in which the bones lose minerals and strength with aging. This can result in serious bone fractures. Your risk for osteoporosis can be identified using a bone density scan.  If you are 74 years of age or older, or if you are at risk for osteoporosis and fractures, ask your health care provider if you should be screened.  Ask your health care provider whether you should take a calcium or vitamin D supplement to lower your risk for osteoporosis.  Menopause may have certain physical symptoms and risks.  Hormone replacement therapy may reduce some of these symptoms and risks. Talk to your health care provider about whether hormone replacement therapy is right for you.  HOME CARE INSTRUCTIONS   Schedule regular health, dental, and eye exams.  Stay current with your immunizations.   Do not use any tobacco products including cigarettes, chewing tobacco, or electronic cigarettes.  If you are pregnant, do not drink alcohol.  If you are breastfeeding, limit how  much and how often you drink alcohol.  Limit alcohol intake to no more than 1 drink per day for nonpregnant women. One drink equals 12 ounces of beer, 5 ounces of wine, or 1 ounces of hard liquor.  Do not use street drugs.  Do not share needles.  Ask your health care provider for help if you need support or information about quitting drugs.  Tell your health care provider if you often feel depressed.  Tell your health care provider if you have ever been abused or do not feel safe at home.   This information is not intended to replace advice given to you by your health care provider. Make sure you discuss any questions you have with your health care provider.   Document Released: 03/01/2011 Document Revised: 09/06/2014 Document Reviewed: 07/18/2013 Elsevier Interactive Patient Education [employer].

## 2015-07-14 ENCOUNTER — Encounter: Payer: Self-pay | Admitting: Family Medicine

## 2015-07-14 LAB — CYTOLOGY - PAP

## 2015-09-26 ENCOUNTER — Ambulatory Visit (INDEPENDENT_AMBULATORY_CARE_PROVIDER_SITE_OTHER): Payer: BLUE CROSS/BLUE SHIELD | Admitting: Family Medicine

## 2015-09-26 ENCOUNTER — Encounter: Payer: Self-pay | Admitting: Family Medicine

## 2015-09-26 VITALS — BP 126/80 | HR 68 | Temp 97.6°F | Wt 191.0 lb

## 2015-09-26 DIAGNOSIS — R300 Dysuria: Secondary | ICD-10-CM | POA: Diagnosis not present

## 2015-09-26 DIAGNOSIS — N3001 Acute cystitis with hematuria: Secondary | ICD-10-CM | POA: Diagnosis not present

## 2015-09-26 LAB — POC URINALSYSI DIPSTICK (AUTOMATED)
Bilirubin, UA: NEGATIVE
Glucose, UA: NEGATIVE
Ketones, UA: NEGATIVE
Nitrite, UA: NEGATIVE
PROTEIN UA: NEGATIVE
Spec Grav, UA: >=1.03
Urobilinogen, UA: 4
pH, UA: 6

## 2015-09-26 MED ORDER — SULFAMETHOXAZOLE-TRIMETHOPRIM 400-80 MG PO TABS: 1.0000 | ORAL_TABLET | Freq: Two times a day (BID) | ORAL | Status: DC

## 2015-09-26 NOTE — Patient Instructions (Signed)
Drink plenty of water and start the antibiotics today.  We'll contact you with your lab report.  Take care.   

## 2015-09-26 NOTE — Progress Notes (Signed)
Pre visit review using our clinic review tool, if applicable. No additional management support is needed unless otherwise documented below in the visit note.  Dysuria:yes, burning/pressure/urgency.  Cloudy urine duration of symptoms: about 5 days.  abdominal pain: lower abd pain when supine Fevers: no  back pain: lower back pain Vomiting: no other concerns: tried AZO home kit, tested positive Tried AZO OTC pills, intermittent help.    Meds, vitals, and allergies reviewed.   ROS: See HPI.  Otherwise negative.    GEN: nad, alert and oriented CV: rrr.  PULM: ctab, no inc wob ABD: soft, +bs, suprapubic area not tender EXT: no edema SKIN: no acute rash BACK: no CVA pain

## 2015-09-27 ENCOUNTER — Encounter: Payer: Self-pay | Admitting: Family Medicine

## 2015-09-28 DIAGNOSIS — N390 Urinary tract infection, site not specified: Secondary | ICD-10-CM | POA: Insufficient documentation

## 2015-09-28 NOTE — Assessment & Plan Note (Signed)
Nontoxic, septra, fluids, ucx, f/u prn.  She agrees.  See notes on labs.  D/w pt at OV about u/a.

## 2015-09-29 ENCOUNTER — Other Ambulatory Visit: Payer: Self-pay | Admitting: Family Medicine

## 2015-09-29 LAB — URINE CULTURE

## 2015-09-29 MED ORDER — CEFDINIR 300 MG PO CAPS: 300.0000 mg | ORAL_CAPSULE | Freq: Two times a day (BID) | ORAL | Status: DC

## 2016-01-12 ENCOUNTER — Encounter: Payer: Self-pay | Admitting: Family Medicine

## 2016-01-13 ENCOUNTER — Ambulatory Visit (INDEPENDENT_AMBULATORY_CARE_PROVIDER_SITE_OTHER): Payer: BLUE CROSS/BLUE SHIELD | Admitting: Family Medicine

## 2016-01-13 ENCOUNTER — Encounter: Payer: Self-pay | Admitting: Family Medicine

## 2016-01-13 VITALS — BP 118/76 | HR 80 | Temp 98.2°F | Wt 192.5 lb

## 2016-01-13 DIAGNOSIS — N3000 Acute cystitis without hematuria: Secondary | ICD-10-CM

## 2016-01-13 LAB — POC URINALSYSI DIPSTICK (AUTOMATED)
Bilirubin, UA: NEGATIVE
GLUCOSE UA: NEGATIVE
Ketones, UA: NEGATIVE
Leukocytes, UA: NEGATIVE
NITRITE UA: NEGATIVE
PH UA: 6
PROTEIN UA: NEGATIVE
Spec Grav, UA: >=1.03
UROBILINOGEN UA: 0.2

## 2016-01-13 MED ORDER — CEPHALEXIN 500 MG PO CAPS: 500.0000 mg | ORAL_CAPSULE | Freq: Two times a day (BID) | ORAL | Status: DC

## 2016-01-13 NOTE — Assessment & Plan Note (Signed)
sxs suspicious for recurrent UTI but no signs of upper tract disease at this time. UA/micro inconclusive. Cover with keflex 7d course, UCx sent. Update if not improving with treatment. Pt agrees with plan.

## 2016-01-13 NOTE — Patient Instructions (Signed)
Treat urine infection with keflex course - sent to pharmacy. We have send culture to ensure we have correct antibiotic. Good to see you today, call us with questions.

## 2016-01-13 NOTE — Telephone Encounter (Signed)
Message left for patient to return my call.  

## 2016-01-13 NOTE — Progress Notes (Signed)
Pre visit review using our clinic review tool, if applicable. No additional management support is needed unless otherwise documented below in the visit note. 

## 2016-01-13 NOTE — Progress Notes (Signed)
BP 118/76 mmHg  Pulse 80  Temp(Src) 98.2 F (36.8 C) (Oral)  Wt 192 lb 8 oz (87.317 kg)   CC: ?UTI  Subjective:    Patient ID: Rachel Bailey, female    DOB: 02/08/1976, 40 y.o.   MRN: [id number]  HPI: Rachel ChristiansLisa Ziolkowski is a 40 y.o. female presenting on 01/13/2016 for Urinary Tract Infection and Headache   Misty StanleyLisa presents today with 2d h/o malaise, vaginal itching and burning, urgency and frequency, low dull back ache. Ongoing mild headache for last few days. Last week had episode of nausea/vomiting, vertigo. Denies fevers/chills, flank pain, hematuria. She tried OTC Azo yesterday. Has been pushing fluids.   H/o febrile pyelonephritis 09/2014 not responsive to bactrim treated with CTX (UCx with >100k Ecoli sensitive to bactrim and other abx, resistant to ampicillin, cefazolin not tested)  H/o UTI 08/2015 treated with bactrim. UCx >100k E coli R bactrim  Treated with omnicef.   H/o recurrent UTIs with pyelo when younger.  ALL: cipro - HA, body aches  Relevant past medical, surgical, family and social history reviewed and updated as indicated. Interim medical history since our last visit reviewed. Allergies and medications reviewed and updated. No current outpatient prescriptions on file prior to visit.   No current facility-administered medications on file prior to visit.   Past Medical History  Diagnosis Date  . History of chicken pox   . Irregular periods   . Acne   . Dyslipidemia   . History of pyelonephritis multiple  . UTI (urinary tract infection)   . Acute pyelonephritis 10/09/2014    Past Surgical History  Procedure Laterality Date  . Dilation and curettage of uterus  1995    due to miscarriage  . Tubal ligation  2012  . Eye surgery      Social History  Substance Use Topics  . Smoking status: Never Smoker   . Smokeless tobacco: Never Used  . Alcohol Use: No    Family History  Problem Relation Age of Onset  . Hyperlipidemia Mother   . Hypertension Mother   . Hyperlipidemia  Father   . Diabetes Paternal Grandmother   . Stroke Maternal Grandfather     x2  . Cancer Neg Hx   . CAD Neg Hx     Review of Systems Per HPI unless specifically indicated in ROS section     Objective:    BP 118/76 mmHg  Pulse 80  Temp(Src) 98.2 F (36.8 C) (Oral)  Wt 192 lb 8 oz (87.317 kg)  Wt Readings from Last 3 Encounters:  01/13/16 192 lb 8 oz (87.317 kg)  09/26/15 191 lb (86.637 kg)  07/11/15 189 lb (85.73 kg)    Physical Exam  Constitutional: She appears well-developed and well-nourished. No distress.  Abdominal: Soft. Normal appearance and bowel sounds are normal. She exhibits no distension and no mass. There is no hepatosplenomegaly. There is no tenderness. There is no rigidity, no rebound, no guarding, no CVA tenderness and negative Murphy's sign.  Musculoskeletal: She exhibits no edema.  Psychiatric: She has a normal mood and affect.  Nursing note and vitals reviewed.  Results for orders placed or performed in visit on 01/13/16  POCT Urinalysis Dipstick (Automated)  Result Value Ref Range   Color, UA Yellow    Clarity, UA Hazy    Glucose, UA Negative    Bilirubin, UA Negative    Ketones, UA Negative    Spec Grav, UA >=1.030    Blood, UA 1+    pH,  UA 6.0    Protein, UA Negative    Urobilinogen, UA 0.2    Nitrite, UA Negative    Leukocytes, UA Negative Negative   Micro: WBC few RBC 0 Bact: 1+ Casts: none Epi: 5-10 UCx sent     Assessment & Plan:   Problem List Items Addressed This Visit    UTI (urinary tract infection) - Primary    sxs suspicious for recurrent UTI but no signs of upper tract disease at this time. UA/micro inconclusive. Cover with keflex 7d course, UCx sent. Update if not improving with treatment. Pt agrees with plan.      Relevant Medications   cephALEXin (KEFLEX) 500 MG capsule   Other Relevant Orders   Urine culture   POCT Urinalysis Dipstick (Automated) (Completed)       Follow up plan: Return if symptoms worsen or  fail to improve.  Eustaquio Boyden, MD

## 2016-01-13 NOTE — Addendum Note (Signed)
Addended by: Eustaquio BoydenGUTIERREZ, Melysa Schroyer on: 01/13/2016 05:19 PM   Modules accepted: Kipp BroodSmartSet

## 2016-01-13 NOTE — Telephone Encounter (Signed)
Spoke with patient and appt scheduled. 

## 2016-01-13 NOTE — Telephone Encounter (Signed)
Offered pt 12:45pm appt on Tuesday for possible UTI. Can we see if pt can come in?

## 2016-01-15 LAB — URINE CULTURE

## 2016-05-17 ENCOUNTER — Ambulatory Visit (INDEPENDENT_AMBULATORY_CARE_PROVIDER_SITE_OTHER): Payer: BLUE CROSS/BLUE SHIELD | Admitting: Family Medicine

## 2016-05-17 ENCOUNTER — Encounter: Payer: Self-pay | Admitting: Family Medicine

## 2016-05-17 VITALS — BP 128/89 | HR 69 | Temp 98.4°F | Resp 20 | Wt 188.5 lb

## 2016-05-17 DIAGNOSIS — B3731 Acute candidiasis of vulva and vagina: Secondary | ICD-10-CM

## 2016-05-17 DIAGNOSIS — B373 Candidiasis of vulva and vagina: Secondary | ICD-10-CM | POA: Diagnosis not present

## 2016-05-17 DIAGNOSIS — R350 Frequency of micturition: Secondary | ICD-10-CM | POA: Diagnosis not present

## 2016-05-17 DIAGNOSIS — N3001 Acute cystitis with hematuria: Secondary | ICD-10-CM

## 2016-05-17 LAB — POC URINALSYSI DIPSTICK (AUTOMATED)
BILIRUBIN UA: NEGATIVE
Glucose, UA: NEGATIVE
KETONES UA: NEGATIVE
LEUKOCYTES UA: NEGATIVE
Nitrite, UA: NEGATIVE
PH UA: 6
SPEC GRAV UA: 1.025
Urobilinogen, UA: 0.2

## 2016-05-17 MED ORDER — FLUCONAZOLE 150 MG PO TABS: ORAL_TABLET | ORAL | 0 refills | Status: DC

## 2016-05-17 MED ORDER — CEPHALEXIN 500 MG PO CAPS: 500.0000 mg | ORAL_CAPSULE | Freq: Three times a day (TID) | ORAL | 0 refills | Status: DC

## 2016-05-17 NOTE — Progress Notes (Signed)
Rachel Bailey , 11/20/1975, 40 y.o., female MRN: [id number] Patient Care Team    Relationship Specialty Notifications Start End  Eustaquio BoydenJavier Gutierrez, MD PCP - General Family Medicine  07/11/15     CC: urinary frequency Subjective:   Rachel Bailey is a 40 y.o. female presenting  for Urinary Tract Infection symptoms since of 1 week duration. She endorses vaginal itching and burning, urgency and frequency. She has had anal itching, which she states is like when she had a yeast infection. She denies low back pain, headache, nausea/vomiting, fever or chills. Her husband is not circumcised and a diabetic, he has been complaining of mild itching as well. She had used a cleaning wipe, and she reports having a yeast infection after using similar product in the past. She denies vaginal discharge, current hemorrhoid or erythema.  H/o febrile pyelonephritis 09/2014 not responsive to bactrim treated with CTX (UCx with >100k Ecoli sensitive to bactrim and other abx, resistant to ampicillin, cefazolin not tested) H/o UTI 08/2015 UCx >100k E coli.  H/o recurrent UTIs with pyelo when younger.  Allergies  Allergen Reactions  . Ciprofloxacin Other (See Comments)    Headaches, bodyaches   Social History  Substance Use Topics  . Smoking status: Never Smoker  . Smokeless tobacco: Never Used  . Alcohol use No   Past Medical History:  Diagnosis Date  . Acne   . Acute pyelonephritis 10/09/2014  . Dyslipidemia   . History of chicken pox   . History of pyelonephritis multiple  . Irregular periods   . UTI (urinary tract infection)    Past Surgical History:  Procedure Laterality Date  . DILATION AND CURETTAGE OF UTERUS  1995   due to miscarriage  . EYE SURGERY    . TUBAL LIGATION  2012   Family History  Problem Relation Age of Onset  . Hyperlipidemia Mother   . Hypertension Mother   . Hyperlipidemia Father   . Diabetes Paternal Grandmother   . Stroke Maternal Grandfather     x2  . Cancer Neg Hx   . CAD Neg  Hx      Medication List    as of 05/17/2016 11:33 AM   You have not been prescribed any medications.     No results found for this or any previous visit (from the past 24 hour(s)). No results found.   ROS: Negative, with the exception of above mentioned in HPI   Objective:  BP 128/89 (BP Location: Right Arm, Patient Position: Sitting, Cuff Size: Large)   Pulse 69   Temp 98.4 F (36.9 C)   Resp 20   Wt 188 lb 8 oz (85.5 kg)   BMI 31.37 kg/m  Body mass index is 31.37 kg/m. Gen: Afebrile. No acute distress. Nontoxic in appearance, well developed, well nourished.  HENT: AT. [place].  MMM, Eyes:Pupils Equal Round Reactive to light, Extraocular movements intact,  Conjunctiva without redness, discharge or icterus. Abd: Soft NTND. BS present. no Masses palpated. No rebound or guarding.  MSK: no cva tenderness bilateral.  Neuro: Normal gait. PERLA. EOMi. Alert. Oriented x3  Psych: Normal affect, dress and demeanor. Normal speech. Normal thought content and judgment.  Assessment/Plan: Rachel Bailey is a 40 y.o. female present for acute OV for  Urinary frequency/Acute cystitis with hematuria/Vaginal yeast infection - POCT Urinalysis Dipstick (Automated) - fluconazole (DIFLUCAN) 150 MG tablet; 1 tab today, repeat in 3 days  Dispense: 2 tablet; Refill: 0 - Urinalysis, Routine w reflex microscopic - Urine Culture -  cephALEXin (KEFLEX) 500 MG capsule; Take 1 capsule (500 mg total) by mouth 3 (three) times daily.  Dispense: 21 capsule; Refill: 0 - Pt will be called with culture results once available. Dicussed yeast treatment, potential causes and prevention. Avoid scented products. She will need a pelvic exam if symptoms are not improved with treatment.   electronically signed by:  Felix Pacini, DO  [facility] - [place]

## 2016-05-17 NOTE — Patient Instructions (Signed)
I have called in diflucan for probable yeast infection. Avoid all wipes/scented toiletries etc. Your husband may to need to be treated/tested as well if he is having symptoms.  I have also called in antibiotic called keflex for you to start, every 8 hours. I have sent your urine for culture. You can use over the counter cream (monistat) for comfort if desired. If symptoms are not improved with treatment you will need to have a pelvic exam to investigate cause.

## 2016-05-18 LAB — URINALYSIS, ROUTINE W REFLEX MICROSCOPIC
BILIRUBIN URINE: NEGATIVE
Glucose, UA: NEGATIVE
KETONES UR: NEGATIVE
Leukocytes, UA: NEGATIVE
Nitrite: NEGATIVE
PROTEIN: NEGATIVE
Specific Gravity, Urine: 1.022 (ref 1.001–1.035)
pH: 6 (ref 5.0–8.0)

## 2016-05-18 LAB — URINALYSIS, MICROSCOPIC ONLY
Bacteria, UA: NONE SEEN [HPF]
Casts: NONE SEEN [LPF]
Crystals: NONE SEEN [HPF]
Yeast: NONE SEEN [HPF]

## 2016-05-19 ENCOUNTER — Telehealth: Payer: Self-pay | Admitting: Family Medicine

## 2016-05-19 LAB — URINE CULTURE: ORGANISM ID, BACTERIA: NO GROWTH

## 2016-05-19 NOTE — Telephone Encounter (Signed)
Please call pt: - her urine culture did not show bacteria growth. She can discontinue the keflex since this is negative. Finish the diflucan, and if symptoms remain she should be seen for a pelvic exam.

## 2016-05-20 NOTE — Telephone Encounter (Signed)
Left message with results and instructions on patient voice mail. 

## 2016-10-15 ENCOUNTER — Ambulatory Visit (INDEPENDENT_AMBULATORY_CARE_PROVIDER_SITE_OTHER): Payer: BLUE CROSS/BLUE SHIELD | Admitting: Family Medicine

## 2016-10-15 ENCOUNTER — Encounter: Payer: Self-pay | Admitting: Family Medicine

## 2016-10-15 ENCOUNTER — Telehealth: Payer: Self-pay | Admitting: Family Medicine

## 2016-10-15 VITALS — BP 130/90 | HR 96 | Temp 98.9°F | Resp 20 | Wt 190.8 lb

## 2016-10-15 DIAGNOSIS — R05 Cough: Secondary | ICD-10-CM

## 2016-10-15 DIAGNOSIS — J40 Bronchitis, not specified as acute or chronic: Secondary | ICD-10-CM

## 2016-10-15 DIAGNOSIS — R059 Cough, unspecified: Secondary | ICD-10-CM

## 2016-10-15 MED ORDER — PREDNISONE 50 MG PO TABS: 50.0000 mg | ORAL_TABLET | Freq: Every day | ORAL | 0 refills | Status: DC

## 2016-10-15 MED ORDER — DOXYCYCLINE HYCLATE 100 MG PO TABS: 100.0000 mg | ORAL_TABLET | Freq: Two times a day (BID) | ORAL | 0 refills | Status: DC

## 2016-10-15 MED ORDER — IPRATROPIUM-ALBUTEROL 0.5-2.5 (3) MG/3ML IN SOLN
3.0000 mL | Freq: Once | RESPIRATORY_TRACT | Status: AC
  Administered 2016-10-15: 3 mL via RESPIRATORY_TRACT

## 2016-10-15 MED ORDER — BENZONATATE 100 MG PO CAPS: 200.0000 mg | ORAL_CAPSULE | Freq: Two times a day (BID) | ORAL | 0 refills | Status: DC | PRN

## 2016-10-15 NOTE — Patient Instructions (Signed)
Doxycyline, prednisone, tessalon perles.  Mucinex DM    F/U if not improved in 1 -2 weeks.   Acute Bronchitis, Adult Acute bronchitis is when air tubes (bronchi) in the lungs suddenly get swollen. The condition can make it hard to breathe. It can also cause these symptoms:  A cough.  Coughing up clear, yellow, or green mucus.  Wheezing.  Chest congestion.  Shortness of breath.  A fever.  Body aches.  Chills.  A sore throat. Follow these instructions at home: Medicines  Take over-the-counter and prescription medicines only as told by your doctor.  If you were prescribed an antibiotic medicine, take it as told by your doctor. Do not stop taking the antibiotic even if you start to feel better. General instructions  Rest.  Drink enough fluids to keep your pee (urine) clear or pale yellow.  Avoid smoking and secondhand smoke. If you smoke and you need help quitting, ask your doctor. Quitting will help your lungs heal faster.  Use an inhaler, cool mist vaporizer, or humidifier as told by your doctor.  Keep all follow-up visits as told by your doctor. This is important. How is this prevented? To lower your risk of getting this condition again:  Wash your hands often with soap and water. If you cannot use soap and water, use hand sanitizer.  Avoid contact with people who have cold symptoms.  Try not to touch your hands to your mouth, nose, or eyes.  Make sure to get the flu shot every year. Contact a doctor if:  Your symptoms do not get better in 2 weeks. Get help right away if:  You cough up blood.  You have chest pain.  You have very bad shortness of breath.  You become dehydrated.  You faint (pass out) or keep feeling like you are going to pass out.  You keep throwing up (vomiting).  You have a very bad headache.  Your fever or chills gets worse. This information is not intended to replace advice given to you by your health care provider. Make sure  you discuss any questions you have with your health care provider. Document Released: 02/02/2008 Document Revised: 03/24/2016 Document Reviewed: 02/04/2016 Elsevier Interactive Patient Education  2017 Elsevier Inc[employer]    Cough, Adult Introduction A cough helps to clear your throat and lungs. A cough may last only 2-3 weeks (acute), or it may last longer than 8 weeks (chronic). Many different things can cause a cough. A cough may be a sign of an illness or another medical condition. Follow these instructions at home:  Pay attention to any changes in your cough.  Take medicines only as told by your doctor.  If you were prescribed an antibiotic medicine, take it as told by your doctor. Do not stop taking it even if you start to feel better.  Talk with your doctor before you try using a cough medicine.  Drink enough fluid to keep your pee (urine) clear or pale yellow.  If the air is dry, use a cold steam vaporizer or humidifier in your home.  Stay away from things that make you cough at work or at home.  If your cough is worse at night, try using extra pillows to raise your head up higher while you sleep.  Do not smoke, and try not to be around smoke. If you need help quitting, ask your doctor.  Do not have caffeine.  Do not drink alcohol.  Rest as needed. Contact a doctor if:  You  have new problems (symptoms).  You cough up yellow fluid (pus).  Your cough does not get better after 2-3 weeks, or your cough gets worse.  Medicine does not help your cough and you are not sleeping well.  You have pain that gets worse or pain that is not helped with medicine.  You have a fever.  You are losing weight and you do not know why.  You have night sweats. Get help right away if:  You cough up blood.  You have trouble breathing.  Your heartbeat is very fast. This information is not intended to replace advice given to you by your health care provider. Make sure you discuss any  questions you have with your health care provider. Document Released: 04/29/2011 Document Revised: 01/22/2016 Document Reviewed: 10/23/2014  2017 Elsevier

## 2016-10-15 NOTE — Telephone Encounter (Signed)
Please call patient to discuss what team health told her this morning. She called to make an appointment because she was having chest congestion however, she had a fever of 101+ so I transferred her to teamhealth. She is awaiting a call as soon as possible to discuss what she needs to do next.  Thank you.

## 2016-10-15 NOTE — Progress Notes (Signed)
Rachel Bailey , 1976-01-11, 41 y.o., female MRN: [id number] Patient Care Team    Relationship Specialty Notifications Start End  Eustaquio Boyden, MD PCP - General Family Medicine  07/11/15     CC: chest congestion Subjective: Pt presents for an acute OV with complaints of chest congestion of 2 weeks duration.  Associated symptoms include cough, chest congestion, fever, nasal congestion, Fatigue, wheezing. Her family has been ill with a virus. She has never been diagnosed with asthma.  Pt has tried mucinex DM and sudafed to ease their symptoms.   Allergies  Allergen Reactions  . Ciprofloxacin Other (See Comments)    Headaches, bodyaches   Social History  Substance Use Topics  . Smoking status: Never Smoker  . Smokeless tobacco: Never Used  . Alcohol use No   Past Medical History:  Diagnosis Date  . Acne   . Acute pyelonephritis 10/09/2014  . Dyslipidemia   . History of chicken pox   . History of pyelonephritis multiple  . Irregular periods   . UTI (urinary tract infection)    Past Surgical History:  Procedure Laterality Date  . DILATION AND CURETTAGE OF UTERUS  1995   due to miscarriage  . EYE SURGERY    . TUBAL LIGATION  2012   Family History  Problem Relation Age of Onset  . Hyperlipidemia Mother   . Hypertension Mother   . Hyperlipidemia Father   . Diabetes Paternal Grandmother   . Stroke Maternal Grandfather     x2  . Cancer Neg Hx   . CAD Neg Hx    Allergies as of 10/15/2016      Reactions   Ciprofloxacin Other (See Comments)   Headaches, bodyaches      Medication List       Accurate as of 10/15/16  4:42 PM. Always use your most recent med list.          benzonatate 100 MG capsule Commonly known as:  TESSALON Take 2 capsules (200 mg total) by mouth 2 (two) times daily as needed for cough.   dextromethorphan-guaiFENesin 30-600 MG 12hr tablet Commonly known as:  MUCINEX DM Take 1 tablet by mouth 2 (two) times daily.   doxycycline 100 MG  tablet Commonly known as:  VIBRA-TABS Take 1 tablet (100 mg total) by mouth 2 (two) times daily.   predniSONE 50 MG tablet Commonly known as:  DELTASONE Take 1 tablet (50 mg total) by mouth daily with breakfast.       No results found for this or any previous visit (from the past 24 hour(s)). No results found.   ROS: Negative, with the exception of above mentioned in HPI   Objective:  BP 130/90 (BP Location: Right Arm, Patient Position: Sitting, Cuff Size: Large)   Pulse 96   Temp 98.9 F (37.2 C)   Resp 20   Wt 190 lb 12 oz (86.5 kg)   SpO2 96%   BMI 31.74 kg/m  Body mass index is 31.74 kg/m. Gen: Afebrile. No acute distress. Nontoxic in appearance, well developed, well nourished. Appears tired.  HENT: AT. [place]. Bilateral TM visualized bilateral fullness. MMM, no oral lesions. Bilateral nares mild erythema, drianage. Throat without erythema or exudates. Moderate cough and hoarseness.  Eyes:Pupils Equal Round Reactive to light, Extraocular movements intact,  Conjunctiva without redness, discharge or icterus. Neck/lymp/endocrine: Supple,no lymphadenopathy CV: RRR  Chest: LLL wheezing. Cough with deep breath.  Abd: Soft. NTND. BS present.  Skin: no rashes, purpura or petechiae.  Neuro:  Normal gait. PERLA. EOMi. Alert. Oriented x3   Assessment/Plan: Rachel Bailey is a 41 y.o. female present for acute OV for  Bronchitis/Cough - doxycycline (VIBRA-TABS) 100 MG tablet; Take 1 tablet (100 mg total) by mouth 2 (two) times daily.  Dispense: 20 tablet; Refill: 0 - predniSONE (DELTASONE) 50 MG tablet; Take 1 tablet (50 mg total) by mouth daily with breakfast.  Dispense: 5 tablet; Re - rest, hydrate, continue mucinex DM. - doxycyline, prednisone burst.  - Duoneb in office treatment helped with breathing and decreased wheezing.  - Declined CXR  - Rest and hydrate.  - F/U if not improving or worsening in 1-2 weeks.    electronically signed by:  Felix Pacinienee Mahesh Sizemore, DO   [facility] - [place]

## 2017-06-22 ENCOUNTER — Encounter: Payer: Self-pay | Admitting: Family Medicine

## 2017-06-22 ENCOUNTER — Ambulatory Visit (INDEPENDENT_AMBULATORY_CARE_PROVIDER_SITE_OTHER): Payer: BLUE CROSS/BLUE SHIELD | Admitting: Family Medicine

## 2017-06-22 VITALS — BP 160/100 | HR 79 | Temp 97.9°F | Wt 195.0 lb

## 2017-06-22 DIAGNOSIS — I10 Essential (primary) hypertension: Secondary | ICD-10-CM | POA: Insufficient documentation

## 2017-06-22 DIAGNOSIS — R030 Elevated blood-pressure reading, without diagnosis of hypertension: Secondary | ICD-10-CM | POA: Diagnosis not present

## 2017-06-22 DIAGNOSIS — M545 Low back pain, unspecified: Secondary | ICD-10-CM

## 2017-06-22 LAB — POC URINALSYSI DIPSTICK (AUTOMATED)
Bilirubin, UA: NEGATIVE
Glucose, UA: NEGATIVE
KETONES UA: NEGATIVE
Leukocytes, UA: NEGATIVE
NITRITE UA: NEGATIVE
PH UA: 6 (ref 5.0–8.0)
Spec Grav, UA: 1.02 (ref 1.010–1.025)
UROBILINOGEN UA: 0.2 U/dL

## 2017-06-22 MED ORDER — SULFAMETHOXAZOLE-TRIMETHOPRIM 800-160 MG PO TABS
1.0000 | ORAL_TABLET | Freq: Two times a day (BID) | ORAL | 0 refills | Status: DC
Start: 2017-06-22 — End: 2017-06-22

## 2017-06-22 MED ORDER — SULFAMETHOXAZOLE-TRIMETHOPRIM 800-160 MG PO TABS: 1.0000 | ORAL_TABLET | Freq: Two times a day (BID) | ORAL | 0 refills | Status: DC

## 2017-06-22 NOTE — Patient Instructions (Signed)
Urine overall looking ok.  Culture sent to evaluate for UTI.  Treat with 3 day bactrim course.  Let us know if not improving with treatment.  Your goal blood pressure is <140/90. Start monitoring blood pressure 1-2 times a week - at home or local pharmacy Work on low salt/sodium diet - goal <1.5gm (1,500mg ) per day. Eat a diet high in fruits/vegetables and whole grains.  Look into mediterranean and DASH diet. Goal activity is 19350min/wk of moderate intensity exercise.  This can be split into 30 minute chunks.  If you are not at this level, you can start with smaller 10-15 min increments and slowly build up activity. Look at www.heart.org for more resources  Return in 1 month for recheck blood pressure.

## 2017-06-22 NOTE — Assessment & Plan Note (Signed)
New. Reviewing chart, BP has been trending up over the past year. Recommend monitor BP at home, reviewed diet and lifestyle changes to improve bp control. RTC 1 mo f/u visit and if persistently elevated, discussed would recommend start medication for this. Pt agrees with plan.

## 2017-06-22 NOTE — Assessment & Plan Note (Signed)
UA overall clear, but given urinary symptoms endorsed will send UCx and start empiric bactrim DS. Will update pt with UCx results. Reviewed avoiding bladder irritants and importance of good hydration with water.

## 2017-06-22 NOTE — Progress Notes (Signed)
BP (!) 160/100 (BP Location: Right Arm, Cuff Size: Normal)   Pulse 79   Temp 97.9 F (36.6 C) (Oral)   Wt 195 lb (88.5 kg)   LMP  (Within Years) Comment: About 2 yrs ago  SpO2 97%   BMI 32.45 kg/m   BP remains elevated on recheck  CC: UTI Subjective:    Patient ID: Rachel ChristiansLisa Lebon, female    DOB: 04/17/1976, 41 y.o.   MRN: [id number]  HPI: Rachel Bailey is a 41 y.o. female presenting on 06/22/2017 for Urinary Tract Infection (itching with urination and lower back pain. Started 06/17/17)   BP elevated today. No h/o HTN. She was rushing to get here today.   5 d h/o anal itching and lower back pain throughout the weekend. Some vaginal itching as well. Some urgency and frequency.  Some constipation.  She started feeling better after increasing water intake.  She took Azo urine test which was positive for leukocytes.   No dysuria, hematuria, flank pain, fevers/chills, nausea/vomiting.  No vaginal discharge or rash.   She has been using flushable wipes recently. This has previously led to UTI and yeast infections in the past.   Relevant past medical, surgical, family and social history reviewed and updated as indicated. Interim medical history since our last visit reviewed. Allergies and medications reviewed and updated. Outpatient Medications Prior to Visit  Medication Sig Dispense Refill  . benzonatate (TESSALON) 100 MG capsule Take 2 capsules (200 mg total) by mouth 2 (two) times daily as needed for cough. 20 capsule 0  . dextromethorphan-guaiFENesin (MUCINEX DM) 30-600 MG 12hr tablet Take 1 tablet by mouth 2 (two) times daily.    Marland Kitchen. doxycycline (VIBRA-TABS) 100 MG tablet Take 1 tablet (100 mg total) by mouth 2 (two) times daily. 20 tablet 0  . predniSONE (DELTASONE) 50 MG tablet Take 1 tablet (50 mg total) by mouth daily with breakfast. 5 tablet 0   No facility-administered medications prior to visit.      Per HPI unless specifically indicated in ROS section below Review of Systems     Objective:    BP (!) 160/100 (BP Location: Right Arm, Cuff Size: Normal)   Pulse 79   Temp 97.9 F (36.6 C) (Oral)   Wt 195 lb (88.5 kg)   LMP  (Within Years) Comment: About 2 yrs ago  SpO2 97%   BMI 32.45 kg/m   Wt Readings from Last 3 Encounters:  06/22/17 195 lb (88.5 kg)  10/15/16 190 lb 12 oz (86.5 kg)  05/17/16 188 lb 8 oz (85.5 kg)    Physical Exam  Constitutional: She appears well-developed and well-nourished. No distress.  HENT:  Mouth/Throat: Oropharynx is clear and moist. No oropharyngeal exudate.  Cardiovascular: Normal rate, regular rhythm, normal heart sounds and intact distal pulses.   No murmur heard. Pulmonary/Chest: Effort normal and breath sounds normal. No respiratory distress. She has no wheezes. She has no rales.  Abdominal: Soft. Normal appearance and bowel sounds are normal. She exhibits no distension and no mass. There is no hepatosplenomegaly. There is tenderness (mild pressure) in the suprapubic area. There is no rigidity, no rebound, no guarding, no CVA tenderness and negative Murphy's sign.  Musculoskeletal: She exhibits no edema.  Skin: Skin is warm and dry. No rash noted.  Psychiatric: She has a normal mood and affect.  Nursing note and vitals reviewed.  Results for orders placed or performed in visit on 06/22/17  POCT Urinalysis Dipstick (Automated)  Result Value Ref Range  Color, UA yellow    Clarity, UA clear    Glucose, UA negative    Bilirubin, UA negative    Ketones, UA negative    Spec Grav, UA 1.020 1.010 - 1.025   Blood, UA 1+    pH, UA 6.0 5.0 - 8.0   Protein, UA 15 mg/dL    Urobilinogen, UA 0.2 0.2 or 1.0 E.U./dL   Nitrite, UA negative    Leukocytes, UA Negative Negative   Micro: WBC rare RBC 0-2 Bact 1+ Casts granular Epi few UCx sent    Assessment & Plan:   Problem List Items Addressed This Visit    Acute midline low back pain without sciatica - Primary    UA overall clear, but given urinary symptoms endorsed  will send UCx and start empiric bactrim DS. Will update pt with UCx results. Reviewed avoiding bladder irritants and importance of good hydration with water.       Relevant Orders   Urine Culture   POCT Urinalysis Dipstick (Automated) (Completed)   Elevated blood pressure reading without diagnosis of hypertension    New. Reviewing chart, BP has been trending up over the past year. Recommend monitor BP at home, reviewed diet and lifestyle changes to improve bp control. RTC 1 mo f/u visit and if persistently elevated, discussed would recommend start medication for this. Pt agrees with plan.           Follow up plan: Return in about 4 weeks (around 07/20/2017) for follow up visit.  Eustaquio Boyden, MD

## 2017-06-23 LAB — URINE CULTURE
MICRO NUMBER:: 81190959
SPECIMEN QUALITY:: ADEQUATE

## 2018-01-19 LAB — HM PAP SMEAR: HM Pap smear: NORMAL

## 2018-02-10 LAB — HM MAMMOGRAPHY

## 2018-09-14 DIAGNOSIS — M546 Pain in thoracic spine: Secondary | ICD-10-CM | POA: Diagnosis not present

## 2018-09-14 DIAGNOSIS — M9901 Segmental and somatic dysfunction of cervical region: Secondary | ICD-10-CM | POA: Diagnosis not present

## 2018-09-14 DIAGNOSIS — M62830 Muscle spasm of back: Secondary | ICD-10-CM | POA: Diagnosis not present

## 2018-09-14 DIAGNOSIS — M9902 Segmental and somatic dysfunction of thoracic region: Secondary | ICD-10-CM | POA: Diagnosis not present

## 2018-09-18 DIAGNOSIS — M62830 Muscle spasm of back: Secondary | ICD-10-CM | POA: Diagnosis not present

## 2018-09-18 DIAGNOSIS — M9902 Segmental and somatic dysfunction of thoracic region: Secondary | ICD-10-CM | POA: Diagnosis not present

## 2018-09-18 DIAGNOSIS — M9901 Segmental and somatic dysfunction of cervical region: Secondary | ICD-10-CM | POA: Diagnosis not present

## 2018-09-18 DIAGNOSIS — M546 Pain in thoracic spine: Secondary | ICD-10-CM | POA: Diagnosis not present

## 2018-09-20 DIAGNOSIS — M546 Pain in thoracic spine: Secondary | ICD-10-CM | POA: Diagnosis not present

## 2018-09-20 DIAGNOSIS — M9901 Segmental and somatic dysfunction of cervical region: Secondary | ICD-10-CM | POA: Diagnosis not present

## 2018-09-20 DIAGNOSIS — M62830 Muscle spasm of back: Secondary | ICD-10-CM | POA: Diagnosis not present

## 2018-09-20 DIAGNOSIS — M9902 Segmental and somatic dysfunction of thoracic region: Secondary | ICD-10-CM | POA: Diagnosis not present

## 2018-09-21 DIAGNOSIS — M9901 Segmental and somatic dysfunction of cervical region: Secondary | ICD-10-CM | POA: Diagnosis not present

## 2018-09-21 DIAGNOSIS — M546 Pain in thoracic spine: Secondary | ICD-10-CM | POA: Diagnosis not present

## 2018-09-21 DIAGNOSIS — M62830 Muscle spasm of back: Secondary | ICD-10-CM | POA: Diagnosis not present

## 2018-09-21 DIAGNOSIS — M9902 Segmental and somatic dysfunction of thoracic region: Secondary | ICD-10-CM | POA: Diagnosis not present

## 2018-09-26 ENCOUNTER — Encounter: Payer: Self-pay | Admitting: Family Medicine

## 2018-09-26 ENCOUNTER — Ambulatory Visit: Payer: Self-pay

## 2018-09-26 ENCOUNTER — Ambulatory Visit (INDEPENDENT_AMBULATORY_CARE_PROVIDER_SITE_OTHER): Payer: BLUE CROSS/BLUE SHIELD | Admitting: Family Medicine

## 2018-09-26 VITALS — BP 140/100 | HR 86 | Temp 98.5°F | Ht 63.75 in | Wt 190.5 lb

## 2018-09-26 DIAGNOSIS — R030 Elevated blood-pressure reading, without diagnosis of hypertension: Secondary | ICD-10-CM

## 2018-09-26 DIAGNOSIS — R509 Fever, unspecified: Secondary | ICD-10-CM

## 2018-09-26 DIAGNOSIS — J029 Acute pharyngitis, unspecified: Secondary | ICD-10-CM

## 2018-09-26 DIAGNOSIS — Z87898 Personal history of other specified conditions: Secondary | ICD-10-CM | POA: Diagnosis not present

## 2018-09-26 DIAGNOSIS — M62830 Muscle spasm of back: Secondary | ICD-10-CM | POA: Diagnosis not present

## 2018-09-26 DIAGNOSIS — M9902 Segmental and somatic dysfunction of thoracic region: Secondary | ICD-10-CM | POA: Diagnosis not present

## 2018-09-26 DIAGNOSIS — M546 Pain in thoracic spine: Secondary | ICD-10-CM | POA: Diagnosis not present

## 2018-09-26 DIAGNOSIS — M9901 Segmental and somatic dysfunction of cervical region: Secondary | ICD-10-CM | POA: Diagnosis not present

## 2018-09-26 LAB — POCT RAPID STREP A (OFFICE): Rapid Strep A Screen: NEGATIVE

## 2018-09-26 MED ORDER — SCOPOLAMINE 1 MG/3DAYS TD PT72: 1.0000 | MEDICATED_PATCH | TRANSDERMAL | 0 refills | Status: DC

## 2018-09-26 NOTE — Assessment & Plan Note (Signed)
Viral URI, possible flu but low risk and out of treatment time period. Will treat symptomatically.  Neg strep test.

## 2018-09-26 NOTE — Telephone Encounter (Signed)
Pt. Reports over the weekend she began with productive cough and sinus symptoms. Also fever - the highest 102. Body aches and chest tightness from coughing.Taking Mucinex DM.Request earliest appointment available. Appointment made with Dr. Ermalene Searing.  Reason for Disposition . [1] Continuous (nonstop) coughing interferes with work or school AND [2] no improvement using cough treatment per protocol  Answer Assessment - Initial Assessment Questions 1. ONSET: "When did the cough begin?"      Started this weekend 2. SEVERITY: "How bad is the cough today?"      Moderate 3. RESPIRATORY DISTRESS: "Describe your breathing."      Chest tightness with cough 4. FEVER: "Do you have a fever?" If so, ask: "What is your temperature, how was it measured, and when did it start?"     Highest 102 5. HEMOPTYSIS: "Are you coughing up any blood?" If so ask: "How much?" (flecks, streaks, tablespoons, etc.)     No 6. TREATMENT: "What have you done so far to treat the cough?" (e.g., meds, fluids, humidifier)     Tylenol, Motrin, Mucinex DM 7. CARDIAC HISTORY: "Do you have any history of heart disease?" (e.g., heart attack, congestive heart failure)      No 8. LUNG HISTORY: "Do you have any history of lung disease?"  (e.g., pulmonary embolus, asthma, emphysema)     No 9. PE RISK FACTORS: "Do you have a history of blood clots?" (or: recent major surgery, recent prolonged travel, bedridden)     No 10. OTHER SYMPTOMS: "Do you have any other symptoms? (e.g., runny nose, wheezing, chest pain)       Runny nose and sinus pressure 11. PREGNANCY: "Is there any chance you are pregnant?" "When was your last menstrual period?"       No 12. TRAVEL: "Have you traveled out of the country in the last month?" (e.g., travel history, exposures)       No  Protocols used: COUGH - ACUTE NON-PRODUCTIVE-A-AH

## 2018-09-26 NOTE — Telephone Encounter (Signed)
Pt has appt on 09/26/18 at 9 AM.

## 2018-09-26 NOTE — Patient Instructions (Addendum)
Follow BP at home... per pt nml at GYN appt. Goal < 140/90.  Rest, fluids.  Ibuprofen for sore throat and fever.  Mucinex DM twice daily.  Call if not improving in 7-10 days as expected, or if shortness of breath.

## 2018-09-26 NOTE — Progress Notes (Signed)
Subjective:    Patient ID: Rachel Bailey, female    DOB: Jan 31, 1976, 43 y.o.   MRN: [id number]  Cough  This is a new problem. The current episode started in the past 7 days (4 days). The problem has been gradually worsening. The cough is productive of sputum. Associated symptoms include ear congestion, a fever, headaches, myalgias, nasal congestion and a sore throat. Pertinent negatives include no chest pain, ear pain, shortness of breath or wheezing. Associated symptoms comments:  102 F 3 days ago  pressure in sinuses  Fairly severe ST.. worse symptoms both sides.. Risk factors: Nonsmoker. She has tried OTC cough suppressant (mucinex DM) for the symptoms. The treatment provided mild relief. There is no history of asthma, COPD, environmental allergies or pneumonia.    Hx of elevated BPs without Dx of HTN    At Gyn nml BP in 12/2017, 01/2018 BP Readings from Last 3 Encounters:  09/26/18 (!) 140/100  06/22/17 (!) 160/100  10/15/16 130/90    Twins, 7 year olds: viral URI, emesis  Social History /Family History/Past Medical History reviewed in detail and updated in EMR if needed. Blood pressure (!) 140/100, pulse 86, temperature 98.5 F (36.9 C), temperature source Oral, height 5' 3.75" (1.619 m), weight 190 lb 8 oz (86.4 kg), SpO2 97 %.  Review of Systems  Constitutional: Positive for fever. Negative for fatigue.  HENT: Positive for sore throat. Negative for congestion and ear pain.   Eyes: Negative for pain.  Respiratory: Positive for cough. Negative for shortness of breath and wheezing.   Cardiovascular: Negative for chest pain, palpitations and leg swelling.  Gastrointestinal: Negative for abdominal pain.  Genitourinary: Negative for dysuria and vaginal bleeding.  Musculoskeletal: Positive for myalgias. Negative for back pain.  Allergic/Immunologic: Negative for environmental allergies.  Neurological: Positive for headaches. Negative for syncope and light-headedness.    Psychiatric/Behavioral: Negative for dysphoric mood.       Objective:   Physical Exam Constitutional:      General: She is not in acute distress.    Appearance: She is well-developed. She is not ill-appearing or toxic-appearing.  HENT:     Head: Normocephalic.     Right Ear: Hearing, ear canal and external ear normal. A middle ear effusion is present. Tympanic membrane is not erythematous, retracted or bulging.     Left Ear: Hearing, ear canal and external ear normal. A middle ear effusion is present. Tympanic membrane is not erythematous, retracted or bulging.     Nose: Mucosal edema present. No rhinorrhea.     Right Sinus: No maxillary sinus tenderness or frontal sinus tenderness.     Left Sinus: No maxillary sinus tenderness or frontal sinus tenderness.     Mouth/Throat:     Pharynx: Uvula midline.     Tonsils: No tonsillar exudate or tonsillar abscesses. Swelling: 1+ on the right. 1+ on the left.  Eyes:     General: Lids are normal. Lids are everted, no foreign bodies appreciated.     Conjunctiva/sclera: Conjunctivae normal.     Pupils: Pupils are equal, round, and reactive to light.  Neck:     Musculoskeletal: Normal range of motion and neck supple.     Thyroid: No thyroid mass or thyromegaly.     Vascular: No carotid bruit.     Trachea: Trachea normal.  Cardiovascular:     Rate and Rhythm: Normal rate and regular rhythm.     Pulses: Normal pulses.     Heart sounds: Normal heart sounds, S1  normal and S2 normal. No murmur. No friction rub. No gallop.   Pulmonary:     Effort: Pulmonary effort is normal. No tachypnea or respiratory distress.     Breath sounds: Normal breath sounds. No decreased breath sounds, wheezing, rhonchi or rales.  Skin:    General: Skin is warm and dry.     Findings: No rash.  Neurological:     Mental Status: She is alert.  Psychiatric:        Mood and Affect: Mood is not anxious or depressed.        Speech: Speech normal.        Behavior:  Behavior normal. Behavior is cooperative.        Judgment: Judgment normal.           Assessment & Plan:

## 2018-09-26 NOTE — Assessment & Plan Note (Signed)
Filled Rx for scopolamine patches  For upcoming cruise.

## 2018-09-26 NOTE — Assessment & Plan Note (Signed)
Follow BP at home... per pt nml at GYN appt. Goal < 140/90

## 2018-09-27 DIAGNOSIS — M546 Pain in thoracic spine: Secondary | ICD-10-CM | POA: Diagnosis not present

## 2018-09-27 DIAGNOSIS — M9902 Segmental and somatic dysfunction of thoracic region: Secondary | ICD-10-CM | POA: Diagnosis not present

## 2018-09-27 DIAGNOSIS — M9901 Segmental and somatic dysfunction of cervical region: Secondary | ICD-10-CM | POA: Diagnosis not present

## 2018-09-27 DIAGNOSIS — M62830 Muscle spasm of back: Secondary | ICD-10-CM | POA: Diagnosis not present

## 2018-09-28 DIAGNOSIS — M62830 Muscle spasm of back: Secondary | ICD-10-CM | POA: Diagnosis not present

## 2018-09-28 DIAGNOSIS — M9901 Segmental and somatic dysfunction of cervical region: Secondary | ICD-10-CM | POA: Diagnosis not present

## 2018-09-28 DIAGNOSIS — M9902 Segmental and somatic dysfunction of thoracic region: Secondary | ICD-10-CM | POA: Diagnosis not present

## 2018-09-28 DIAGNOSIS — M546 Pain in thoracic spine: Secondary | ICD-10-CM | POA: Diagnosis not present

## 2018-10-02 DIAGNOSIS — M9901 Segmental and somatic dysfunction of cervical region: Secondary | ICD-10-CM | POA: Diagnosis not present

## 2018-10-02 DIAGNOSIS — M9902 Segmental and somatic dysfunction of thoracic region: Secondary | ICD-10-CM | POA: Diagnosis not present

## 2018-10-02 DIAGNOSIS — M62830 Muscle spasm of back: Secondary | ICD-10-CM | POA: Diagnosis not present

## 2018-10-02 DIAGNOSIS — M546 Pain in thoracic spine: Secondary | ICD-10-CM | POA: Diagnosis not present

## 2018-10-04 DIAGNOSIS — M62830 Muscle spasm of back: Secondary | ICD-10-CM | POA: Diagnosis not present

## 2018-10-04 DIAGNOSIS — M9901 Segmental and somatic dysfunction of cervical region: Secondary | ICD-10-CM | POA: Diagnosis not present

## 2018-10-04 DIAGNOSIS — M9902 Segmental and somatic dysfunction of thoracic region: Secondary | ICD-10-CM | POA: Diagnosis not present

## 2018-10-04 DIAGNOSIS — M546 Pain in thoracic spine: Secondary | ICD-10-CM | POA: Diagnosis not present

## 2018-10-09 DIAGNOSIS — M62830 Muscle spasm of back: Secondary | ICD-10-CM | POA: Diagnosis not present

## 2018-10-09 DIAGNOSIS — M546 Pain in thoracic spine: Secondary | ICD-10-CM | POA: Diagnosis not present

## 2018-10-09 DIAGNOSIS — M9902 Segmental and somatic dysfunction of thoracic region: Secondary | ICD-10-CM | POA: Diagnosis not present

## 2018-10-09 DIAGNOSIS — M9901 Segmental and somatic dysfunction of cervical region: Secondary | ICD-10-CM | POA: Diagnosis not present

## 2018-10-11 DIAGNOSIS — M9901 Segmental and somatic dysfunction of cervical region: Secondary | ICD-10-CM | POA: Diagnosis not present

## 2018-10-11 DIAGNOSIS — M546 Pain in thoracic spine: Secondary | ICD-10-CM | POA: Diagnosis not present

## 2018-10-11 DIAGNOSIS — M9902 Segmental and somatic dysfunction of thoracic region: Secondary | ICD-10-CM | POA: Diagnosis not present

## 2018-10-11 DIAGNOSIS — M62830 Muscle spasm of back: Secondary | ICD-10-CM | POA: Diagnosis not present

## 2018-10-12 DIAGNOSIS — M9902 Segmental and somatic dysfunction of thoracic region: Secondary | ICD-10-CM | POA: Diagnosis not present

## 2018-10-12 DIAGNOSIS — M9901 Segmental and somatic dysfunction of cervical region: Secondary | ICD-10-CM | POA: Diagnosis not present

## 2018-10-12 DIAGNOSIS — M546 Pain in thoracic spine: Secondary | ICD-10-CM | POA: Diagnosis not present

## 2018-10-12 DIAGNOSIS — M62830 Muscle spasm of back: Secondary | ICD-10-CM | POA: Diagnosis not present

## 2018-10-26 DIAGNOSIS — M546 Pain in thoracic spine: Secondary | ICD-10-CM | POA: Diagnosis not present

## 2018-10-26 DIAGNOSIS — M9902 Segmental and somatic dysfunction of thoracic region: Secondary | ICD-10-CM | POA: Diagnosis not present

## 2018-10-26 DIAGNOSIS — M62830 Muscle spasm of back: Secondary | ICD-10-CM | POA: Diagnosis not present

## 2018-10-26 DIAGNOSIS — M9901 Segmental and somatic dysfunction of cervical region: Secondary | ICD-10-CM | POA: Diagnosis not present

## 2019-12-13 ENCOUNTER — Ambulatory Visit: Payer: BC Managed Care – PPO | Attending: Internal Medicine

## 2019-12-13 DIAGNOSIS — Z23 Encounter for immunization: Secondary | ICD-10-CM

## 2019-12-13 DIAGNOSIS — L578 Other skin changes due to chronic exposure to nonionizing radiation: Secondary | ICD-10-CM | POA: Diagnosis not present

## 2019-12-13 DIAGNOSIS — D1801 Hemangioma of skin and subcutaneous tissue: Secondary | ICD-10-CM | POA: Diagnosis not present

## 2019-12-13 DIAGNOSIS — L820 Inflamed seborrheic keratosis: Secondary | ICD-10-CM | POA: Diagnosis not present

## 2019-12-13 NOTE — Progress Notes (Signed)
   Covid-19 Vaccination Clinic  Name:  Rachel Bailey    MRN: [id number] DOB: 1975/11/22  12/13/2019  Rachel Bailey was observed post Covid-19 immunization for 15 minutes without incident. She was provided with Vaccine Information Sheet and instruction to access the V-Safe system.   Rachel Bailey was instructed to call 911 with any severe reactions post vaccine: Marland Kitchen Difficulty breathing  . Swelling of face and throat  . A fast heartbeat  . A bad rash all over body  . Dizziness and weakness   Immunizations Administered    Name Date Dose VIS Date Route   Pfizer COVID-19 Vaccine 12/13/2019  8:46 AM 0.3 mL 08/10/2019 Intramuscular   Manufacturer: [employer], [employer]   Lot: [id number]   NDC: [address]-8

## 2019-12-13 NOTE — Progress Notes (Signed)
   Covid-19 Vaccination Clinic  Name:  Rachel Bailey    MRN: [id number] DOB: 06/05/1976  12/13/2019  [name] was observed post Covid-19 immunization for 15 minutes without incident. She was provided with Vaccine Information Sheet and instruction to access the V-Safe system.   [name] was instructed to call 911 with any severe reactions post vaccine: . Difficulty breathing  . Swelling of face and throat  . A fast heartbeat  . A bad rash all over body  . Dizziness and weakness   Immunizations Administered    Name Date Dose VIS Date Route   Pfizer COVID-19 Vaccine 12/13/2019  8:46 AM 0.3 mL 08/10/2019 Intramuscular   Manufacturer: Pfizer, Inc   Lot: [id number]   NDC: [address]-2     

## 2020-01-08 ENCOUNTER — Ambulatory Visit: Payer: BC Managed Care – PPO

## 2020-01-09 ENCOUNTER — Ambulatory Visit: Payer: BC Managed Care – PPO

## 2020-01-14 ENCOUNTER — Ambulatory Visit: Payer: BC Managed Care – PPO | Attending: Internal Medicine

## 2020-01-14 DIAGNOSIS — Z23 Encounter for immunization: Secondary | ICD-10-CM

## 2020-01-14 NOTE — Progress Notes (Signed)
° °  Covid-19 Vaccination Clinic  Name:  Rachel Bailey    MRN: [id number] DOB: Jan 06, 1976  01/14/2020  [name] was observed post Covid-19 immunization for 15 minutes without incident. She was provided with Vaccine Information Sheet and instruction to access the V-Safe system.   [name] was instructed to call 911 with any severe reactions post vaccine:  Difficulty breathing   Swelling of face and throat   A fast heartbeat   A bad rash all over body   Dizziness and weakness   Immunizations Administered    Name Date Dose VIS Date Route   Pfizer COVID-19 Vaccine 01/14/2020 12:01 PM 0.3 mL 10/24/2018 Intramuscular   Manufacturer: [employer], [employer]   Lot: [id number]   NDC: [address]-5

## 2020-02-06 DIAGNOSIS — M546 Pain in thoracic spine: Secondary | ICD-10-CM | POA: Diagnosis not present

## 2020-02-06 DIAGNOSIS — M9901 Segmental and somatic dysfunction of cervical region: Secondary | ICD-10-CM | POA: Diagnosis not present

## 2020-02-06 DIAGNOSIS — M62830 Muscle spasm of back: Secondary | ICD-10-CM | POA: Diagnosis not present

## 2020-02-06 DIAGNOSIS — M9902 Segmental and somatic dysfunction of thoracic region: Secondary | ICD-10-CM | POA: Diagnosis not present

## 2020-02-11 DIAGNOSIS — M9902 Segmental and somatic dysfunction of thoracic region: Secondary | ICD-10-CM | POA: Diagnosis not present

## 2020-02-11 DIAGNOSIS — M9901 Segmental and somatic dysfunction of cervical region: Secondary | ICD-10-CM | POA: Diagnosis not present

## 2020-02-11 DIAGNOSIS — M62830 Muscle spasm of back: Secondary | ICD-10-CM | POA: Diagnosis not present

## 2020-02-11 DIAGNOSIS — M546 Pain in thoracic spine: Secondary | ICD-10-CM | POA: Diagnosis not present

## 2020-02-14 DIAGNOSIS — M546 Pain in thoracic spine: Secondary | ICD-10-CM | POA: Diagnosis not present

## 2020-02-14 DIAGNOSIS — M62830 Muscle spasm of back: Secondary | ICD-10-CM | POA: Diagnosis not present

## 2020-02-14 DIAGNOSIS — M9902 Segmental and somatic dysfunction of thoracic region: Secondary | ICD-10-CM | POA: Diagnosis not present

## 2020-02-14 DIAGNOSIS — M9901 Segmental and somatic dysfunction of cervical region: Secondary | ICD-10-CM | POA: Diagnosis not present

## 2020-02-18 DIAGNOSIS — M62830 Muscle spasm of back: Secondary | ICD-10-CM | POA: Diagnosis not present

## 2020-02-18 DIAGNOSIS — M9903 Segmental and somatic dysfunction of lumbar region: Secondary | ICD-10-CM | POA: Diagnosis not present

## 2020-02-18 DIAGNOSIS — M5127 Other intervertebral disc displacement, lumbosacral region: Secondary | ICD-10-CM | POA: Diagnosis not present

## 2020-02-25 DIAGNOSIS — M9903 Segmental and somatic dysfunction of lumbar region: Secondary | ICD-10-CM | POA: Diagnosis not present

## 2020-02-25 DIAGNOSIS — M62830 Muscle spasm of back: Secondary | ICD-10-CM | POA: Diagnosis not present

## 2020-02-25 DIAGNOSIS — M5127 Other intervertebral disc displacement, lumbosacral region: Secondary | ICD-10-CM | POA: Diagnosis not present

## 2020-03-05 ENCOUNTER — Encounter: Payer: Self-pay | Admitting: Family Medicine

## 2020-03-05 ENCOUNTER — Telehealth: Payer: Self-pay | Admitting: *Deleted

## 2020-03-05 DIAGNOSIS — J028 Acute pharyngitis due to other specified organisms: Secondary | ICD-10-CM | POA: Diagnosis not present

## 2020-03-05 DIAGNOSIS — J029 Acute pharyngitis, unspecified: Secondary | ICD-10-CM | POA: Diagnosis not present

## 2020-03-05 DIAGNOSIS — Z881 Allergy status to other antibiotic agents status: Secondary | ICD-10-CM | POA: Diagnosis not present

## 2020-03-05 DIAGNOSIS — R030 Elevated blood-pressure reading, without diagnosis of hypertension: Secondary | ICD-10-CM | POA: Diagnosis not present

## 2020-03-05 DIAGNOSIS — R070 Pain in throat: Secondary | ICD-10-CM | POA: Diagnosis not present

## 2020-03-05 DIAGNOSIS — I10 Essential (primary) hypertension: Secondary | ICD-10-CM | POA: Diagnosis not present

## 2020-03-05 NOTE — Telephone Encounter (Signed)
Spoke with pt to schedule ER f/u.  Pt was seen at [facility] in [place] then sent to [facility] in [place].  Is it ok to use the 8:30 slot tomorrow?

## 2020-03-05 NOTE — Telephone Encounter (Addendum)
Per Dr. Reece Agar, pt can be added at 10:00 for 30 min ER f/u.    Attempted to notify pt by phn.  No answer.  Sending message via MyChart.

## 2020-03-05 NOTE — Telephone Encounter (Signed)
Patient left a voicemail stating that she had called the office earlier for an appointment for a severe sore throat. Patient stated that she was advised that  there were not any available appointments today for a virtual visit. Patient stated that she was told she should go to an urgent care.  Patient stated that she went to a [facility] in [place]. Patient stated that the strep test was negative, but her blood pressure is 190/140 and they called 911 for EMS to come and take her to the ER for evaluation. Patient stated that she wanted a call back to let her know if she should go to the ER. Called patient back and advised her unfortunately with her symptoms we can not bring her into the office. Advised patient with the recommendation from the provider at the [facility] and her blood pressure reading she should go to the ER for evaluation. Patient spoke with the paramedics and they are going to take her to [facility] ER for evaluation.

## 2020-03-06 ENCOUNTER — Other Ambulatory Visit: Payer: Self-pay

## 2020-03-06 ENCOUNTER — Ambulatory Visit (INDEPENDENT_AMBULATORY_CARE_PROVIDER_SITE_OTHER): Payer: BC Managed Care – PPO | Admitting: Family Medicine

## 2020-03-06 ENCOUNTER — Encounter: Payer: Self-pay | Admitting: Family Medicine

## 2020-03-06 DIAGNOSIS — I10 Essential (primary) hypertension: Secondary | ICD-10-CM | POA: Diagnosis not present

## 2020-03-06 DIAGNOSIS — K122 Cellulitis and abscess of mouth: Secondary | ICD-10-CM | POA: Insufficient documentation

## 2020-03-06 MED ORDER — AMLODIPINE BESYLATE 5 MG PO TABS: 5.0000 mg | ORAL_TABLET | Freq: Every day | ORAL | 1 refills | Status: DC

## 2020-03-06 NOTE — Assessment & Plan Note (Addendum)
Story and exam consistent with uvulitis. Actually today better than yesterday suggesting viral infection. Continue supportive care for now.  If progressive respiratory symptoms, consider COVID test.

## 2020-03-06 NOTE — Telephone Encounter (Signed)
Pt added to schedule at 10:00 today.

## 2020-03-06 NOTE — Assessment & Plan Note (Signed)
Presented to [facility] then ER with hypertensive urgency. Records reviewed through [facility]. Started on amlodipine 5mg  yesterday. Hasn't taken today's dose yet. Will continue this - and I asked her to contact me with BP readings in 1-2 wks and if staying elevated then will increase dose to 10mg  daily. I asked her to buy automatic arm cuff to monitor at home. RTC 1 mo f/u visit. Reviewed red flags to seek urgent care. DASH diet handout provided as well as general approaches to control hypertension.

## 2020-03-06 NOTE — Patient Instructions (Addendum)
BP is staying high - I agree with amlodipine 5mg  daily. Continue this (refilled to pharmacy). Buy automatic arm BP cuff to monitor readings at home. After 2 weeks call with readings and if staying >150/100, increase amlodipine to 10mg  (2 tablets) daily.  Seek care if bad headache or vision changes associated with high blood pressure or chest pain/tightness, shortness of breath develops.   Your goal blood pressure is <140/90. Work on low salt/sodium diet - goal <2gm (2,000mg ) per day. Eat a diet high in fruits/vegetables and whole grains.  Look into mediterranean and DASH diet.  Goal activity is 129min/wk of moderate intensity exercise.  This can be split into 30 minute chunks.  If you are not at this level, you can start with smaller 10-15 min increments and slowly build up activity. Look at www.heart.org for more resources   DASH Eating Plan DASH stands for "Dietary Approaches to Stop Hypertension." The DASH eating plan is a healthy eating plan that has been shown to reduce high blood pressure (hypertension). It may also reduce your risk for type 2 diabetes, heart disease, and stroke. The DASH eating plan may also help with weight loss. What are tips for following this plan?  General guidelines  Avoid eating more than 2,300 mg (milligrams) of salt (sodium) a day. If you have hypertension, you may need to reduce your sodium intake to 1,500 mg a day.  Limit alcohol intake to no more than 1 drink a day for nonpregnant women and 2 drinks a day for men. One drink equals 12 oz of beer, 5 oz of wine, or 1 oz of hard liquor.  Work with your health care provider to maintain a healthy body weight or to lose weight. Ask what an ideal weight is for you.  Get at least 30 minutes of exercise that causes your heart to beat faster (aerobic exercise) most days of the week. Activities may include walking, swimming, or biking.  Work with your health care provider or diet and nutrition specialist (dietitian)  to adjust your eating plan to your individual calorie needs. Reading food labels   Check food labels for the amount of sodium per serving. Choose foods with less than 5 percent of the Daily Value of sodium. Generally, foods with less than 300 mg of sodium per serving fit into this eating plan.  To find whole grains, look for the word "whole" as the first word in the ingredient list. Shopping  Buy products labeled as "low-sodium" or "no salt added."  Buy fresh foods. Avoid canned foods and premade or frozen meals. Cooking  Avoid adding salt when cooking. Use salt-free seasonings or herbs instead of table salt or sea salt. Check with your health care provider or pharmacist before using salt substitutes.  Do not fry foods. Cook foods using healthy methods such as baking, boiling, grilling, and broiling instead.  Cook with heart-healthy oils, such as olive, canola, soybean, or sunflower oil. Meal planning  Eat a balanced diet that includes: ? 5 or more servings of fruits and vegetables each day. At each meal, try to fill half of your plate with fruits and vegetables. ? Up to 6-8 servings of whole grains each day. ? Less than 6 oz of lean meat, poultry, or fish each day. A 3-oz serving of meat is about the same size as a deck of cards. One egg equals 1 oz. ? 2 servings of low-fat dairy each day. ? A serving of nuts, seeds, or beans 5 times  each week. ? Heart-healthy fats. Healthy fats called Omega-3 fatty acids are found in foods such as flaxseeds and coldwater fish, like sardines, salmon, and mackerel.  Limit how much you eat of the following: ? Canned or prepackaged foods. ? Food that is high in trans fat, such as fried foods. ? Food that is high in saturated fat, such as fatty meat. ? Sweets, desserts, sugary drinks, and other foods with added sugar. ? Full-fat dairy products.  Do not salt foods before eating.  Try to eat at least 2 vegetarian meals each week.  Eat more  home-cooked food and less restaurant, buffet, and fast food.  When eating at a restaurant, ask that your food be prepared with less salt or no salt, if possible. What foods are recommended? The items listed may not be a complete list. Talk with your dietitian about what dietary choices are best for you. Grains Whole-grain or whole-wheat bread. Whole-grain or whole-wheat pasta. Brown rice. [place] Morrow. Bulgur. Whole-grain and low-sodium cereals. Pita bread. Low-fat, low-sodium crackers. Whole-wheat flour tortillas. Vegetables Fresh or frozen vegetables (raw, steamed, roasted, or grilled). Low-sodium or reduced-sodium tomato and vegetable juice. Low-sodium or reduced-sodium tomato sauce and tomato paste. Low-sodium or reduced-sodium canned vegetables. Fruits All fresh, dried, or frozen fruit. Canned fruit in natural juice (without added sugar). Meat and other protein foods Skinless chicken or [place]. Ground chicken or [place]. Pork with fat trimmed off. Fish and seafood. Egg whites. Dried beans, peas, or lentils. Unsalted nuts, nut butters, and seeds. Unsalted canned beans. Lean cuts of beef with fat trimmed off. Low-sodium, lean deli meat. Dairy Low-fat (1%) or fat-free (skim) milk. Fat-free, low-fat, or reduced-fat cheeses. Nonfat, low-sodium ricotta or cottage cheese. Low-fat or nonfat yogurt. Low-fat, low-sodium cheese. Fats and oils Soft margarine without trans fats. Vegetable oil. Low-fat, reduced-fat, or light mayonnaise and salad dressings (reduced-sodium). Canola, safflower, olive, soybean, and sunflower oils. Avocado. Seasoning and other foods Herbs. Spices. Seasoning mixes without salt. Unsalted popcorn and pretzels. Fat-free sweets. What foods are not recommended? The items listed may not be a complete list. Talk with your dietitian about what dietary choices are best for you. Grains Baked goods made with fat, such as croissants, muffins, or some breads. Dry pasta or rice meal  packs. Vegetables Creamed or fried vegetables. Vegetables in a cheese sauce. Regular canned vegetables (not low-sodium or reduced-sodium). Regular canned tomato sauce and paste (not low-sodium or reduced-sodium). Regular tomato and vegetable juice (not low-sodium or reduced-sodium). Angie Fava. Olives. Fruits Canned fruit in a light or heavy syrup. Fried fruit. Fruit in cream or butter sauce. Meat and other protein foods Fatty cuts of meat. Ribs. Fried meat. Berniece Salines. Sausage. Bologna and other processed lunch meats. Salami. Fatback. Hotdogs. Bratwurst. Salted nuts and seeds. Canned beans with added salt. Canned or smoked fish. Whole eggs or egg yolks. Chicken or [place] with skin. Dairy Whole or 2% milk, cream, and half-and-half. Whole or full-fat cream cheese. Whole-fat or sweetened yogurt. Full-fat cheese. Nondairy creamers. Whipped toppings. Processed cheese and cheese spreads. Fats and oils Butter. Stick margarine. Lard. Shortening. Ghee. Bacon fat. Tropical oils, such as coconut, palm kernel, or palm oil. Seasoning and other foods Salted popcorn and pretzels. Onion salt, garlic salt, seasoned salt, table salt, and sea salt. Worcestershire sauce. Tartar sauce. Barbecue sauce. Teriyaki sauce. Soy sauce, including reduced-sodium. Steak sauce. Canned and packaged gravies. Fish sauce. Oyster sauce. Cocktail sauce. Horseradish that you find on the shelf. Ketchup. Mustard. Meat flavorings and tenderizers. Bouillon cubes. Hot sauce  and Tabasco sauce. Premade or packaged marinades. Premade or packaged taco seasonings. Relishes. Regular salad dressings. Where to find more information:  National Heart, Lung, and [place]: https://wilson-eaton.com/  American Heart Association: www.heart.org Summary  The DASH eating plan is a healthy eating plan that has been shown to reduce high blood pressure (hypertension). It may also reduce your risk for type 2 diabetes, heart disease, and stroke.  With the DASH eating  plan, you should limit salt (sodium) intake to 2,300 mg a day. If you have hypertension, you may need to reduce your sodium intake to 1,500 mg a day.  When on the DASH eating plan, aim to eat more fresh fruits and vegetables, whole grains, lean proteins, low-fat dairy, and heart-healthy fats.  Work with your health care provider or diet and nutrition specialist (dietitian) to adjust your eating plan to your individual calorie needs. This information is not intended to replace advice given to you by your health care provider. Make sure you discuss any questions you have with your health care provider. Document Revised: 07/29/2017 Document Reviewed: 08/09/2016 Elsevier Patient Education  2020 [employer].

## 2020-03-06 NOTE — Progress Notes (Signed)
This visit was conducted in person.  BP (!) 170/120 (BP Location: Right Arm, Patient Position: Sitting, Cuff Size: Normal)   Pulse 80   Temp 97.6 F (36.4 C) (Temporal)   Ht 5' 3.75" (1.619 m)   Wt 198 lb 7 oz (90 kg)   LMP  (Within Years) Comment: about 4 yrs ago  SpO2 97%   BMI 34.33 kg/m   BP Readings from Last 3 Encounters:  03/06/20 (!) 170/120  09/26/18 (!) 140/100  06/22/17 (!) 160/100   elevated on repeat CC: ER f/u visit  Subjective:    Patient ID: Rachel Bailey, female    DOB: 11-19-1975, 44 y.o.   MRN: [id number]  HPI: Rachel Bailey is a 44 y.o. female presenting on 03/06/2020 for Hospitalization Follow-up (Seen at [facility] on 03/05/20.)   Recent records reviewed.  Seen at [facility] with bad ST and enlarged uvula dx acute pharyngitis with neg rapid strep test. Found to have incidental finding of hypertensive urgency BP 190-200s/140. referred to ER. Seen at [facility], started on amlodipine 5mg  daily. Did not take dose yet today.  Was told EKG was normal - I don't have records of this.   She tries to limit salt in diet.  Notes occasional headaches.  Denies vision changes, CP/tightness, SOB, leg swelling.   Completed COVID vaccine.   No fevers/chills, cough.      Relevant past medical, surgical, family and social history reviewed and updated as indicated. Interim medical history since our last visit reviewed. Allergies and medications reviewed and updated. Outpatient Medications Prior to Visit  Medication Sig Dispense Refill  . ibuprofen (ADVIL) 200 MG tablet Take by mouth.    amLODipine (NORVASC) 5 MG tablet Take by mouth.    Marland Kitchen scopolamine (TRANSDERM-SCOP, 1.5 MG,) 1 MG/3DAYS Place 1 patch (1.5 mg total) onto the skin every 3 (three) days. 4 patch 0   No facility-administered medications prior to visit.     Per HPI unless specifically indicated in ROS section below Review of Systems Objective:  BP (!) 170/120 (BP Location: Right  Arm, Patient Position: Sitting, Cuff Size: Normal)   Pulse 80   Temp 97.6 F (36.4 C) (Temporal)   Ht 5' 3.75" (1.619 m)   Wt 198 lb 7 oz (90 kg)   LMP  (Within Years) Comment: about 4 yrs ago  SpO2 97%   BMI 34.33 kg/m   Wt Readings from Last 3 Encounters:  03/06/20 198 lb 7 oz (90 kg)  09/26/18 190 lb 8 oz (86.4 kg)  06/22/17 195 lb (88.5 kg)      Physical Exam Vitals and nursing note reviewed.  Constitutional:      Appearance: Normal appearance. She is not ill-appearing.  HENT:     Mouth/Throat:     Mouth: Mucous membranes are moist.     Tongue: No lesions.     Pharynx: Oropharynx is clear. No oropharyngeal exudate or posterior oropharyngeal erythema.     Tonsils: No tonsillar exudate.      Comments: Swollen erythematous uvula Neck:     Thyroid: No thyromegaly or thyroid tenderness.  Cardiovascular:     Rate and Rhythm: Normal rate and regular rhythm.     Pulses: Normal pulses.     Heart sounds: Normal heart sounds. No murmur heard.   Pulmonary:     Effort: Pulmonary effort is normal. No respiratory distress.     Breath sounds: Normal breath sounds. No wheezing, rhonchi or rales.  Musculoskeletal:  Right lower leg: No edema.     Left lower leg: No edema.  Lymphadenopathy:     Cervical: No cervical adenopathy.  Skin:    General: Skin is warm and dry.     Findings: No rash.  Neurological:     Mental Status: She is alert.  Psychiatric:        Mood and Affect: Mood normal.        Behavior: Behavior normal.       Results for orders placed or performed in visit on 09/26/18  POCT rapid strep A  Result Value Ref Range   Rapid Strep A Screen Negative Negative   Assessment & Plan:  This visit occurred during the SARS-CoV-2 public health emergency.  Safety protocols were in place, including screening questions prior to the visit, additional usage of staff PPE, and extensive cleaning of exam room while observing appropriate contact time as indicated for  disinfecting solutions.   Problem List Items Addressed This Visit    Uvulitis    Story and exam consistent with uvulitis. Actually today better than yesterday suggesting viral infection. Continue supportive care for now.  If progressive respiratory symptoms, consider COVID test.       Hypertension    Presented to [facility] then ER with hypertensive urgency. Records reviewed through [facility]. Started on amlodipine 5mg  yesterday. Hasn't taken today's dose yet. Will continue this - and I asked her to contact me with BP readings in 1-2 wks and if staying elevated then will increase dose to 10mg  daily. I asked her to buy automatic arm cuff to monitor at home. RTC 1 mo f/u visit. Reviewed red flags to seek urgent care. DASH diet handout provided as well as general approaches to control hypertension.       Relevant Medications   amLODipine (NORVASC) 5 MG tablet       Meds ordered this encounter  Medications  . amLODipine (NORVASC) 5 MG tablet    Sig: Take 1 tablet (5 mg total) by mouth daily.    Dispense:  90 tablet    Refill:  1   No orders of the defined types were placed in this encounter.   Patient instructions: BP is staying high - I agree with amlodipine 5mg  daily. Continue this (refilled to pharmacy). Buy automatic arm BP cuff to monitor readings at home. After 2 weeks call with readings and if staying >150/100, increase amlodipine to 10mg  (2 tablets) daily.  Seek care if bad headache or vision changes associated with high blood pressure or chest pain/tightness, shortness of breath develops.   Your goal blood pressure is <140/90. Work on low salt/sodium diet - goal <2gm (2,000mg ) per day. Eat a diet high in fruits/vegetables and whole grains.  Look into mediterranean and DASH diet.  Goal activity is 150min/wk of moderate intensity exercise.  This can be split into 30 minute chunks.  If you are not at this level, you can start with smaller 10-15 min increments and slowly build up  activity. Look at www.heart.org for more resources   Follow up plan: Return in about 4 weeks (around 04/03/2020) for follow up visit.  [place], MD

## 2020-03-07 ENCOUNTER — Inpatient Hospital Stay: Payer: BC Managed Care – PPO | Admitting: Family Medicine

## 2020-03-07 ENCOUNTER — Encounter: Payer: Self-pay | Admitting: Family Medicine

## 2020-03-08 DIAGNOSIS — Z20822 Contact with and (suspected) exposure to covid-19: Secondary | ICD-10-CM | POA: Diagnosis not present

## 2020-03-08 DIAGNOSIS — Z03818 Encounter for observation for suspected exposure to other biological agents ruled out: Secondary | ICD-10-CM | POA: Diagnosis not present

## 2020-03-08 MED ORDER — AMOXICILLIN 875 MG PO TABS: 875.0000 mg | ORAL_TABLET | Freq: Two times a day (BID) | ORAL | 0 refills | Status: DC

## 2020-03-12 ENCOUNTER — Encounter: Payer: Self-pay | Admitting: Family Medicine

## 2020-03-12 DIAGNOSIS — M9903 Segmental and somatic dysfunction of lumbar region: Secondary | ICD-10-CM | POA: Diagnosis not present

## 2020-03-12 DIAGNOSIS — M5127 Other intervertebral disc displacement, lumbosacral region: Secondary | ICD-10-CM | POA: Diagnosis not present

## 2020-03-12 DIAGNOSIS — M62830 Muscle spasm of back: Secondary | ICD-10-CM | POA: Diagnosis not present

## 2020-03-12 NOTE — Telephone Encounter (Signed)
Printed and placed in Dr. G's box.  

## 2020-03-20 DIAGNOSIS — M62830 Muscle spasm of back: Secondary | ICD-10-CM | POA: Diagnosis not present

## 2020-03-20 DIAGNOSIS — M5127 Other intervertebral disc displacement, lumbosacral region: Secondary | ICD-10-CM | POA: Diagnosis not present

## 2020-03-20 DIAGNOSIS — M9903 Segmental and somatic dysfunction of lumbar region: Secondary | ICD-10-CM | POA: Diagnosis not present

## 2020-03-21 MED ORDER — BENAZEPRIL HCL 10 MG PO TABS: 10.0000 mg | ORAL_TABLET | Freq: Every day | ORAL | 6 refills | Status: DC

## 2020-03-21 NOTE — Addendum Note (Signed)
Addended by: Eustaquio Boyden on: 03/21/2020 05:16 PM   Modules accepted: Orders

## 2020-03-24 DIAGNOSIS — M9903 Segmental and somatic dysfunction of lumbar region: Secondary | ICD-10-CM | POA: Diagnosis not present

## 2020-03-24 DIAGNOSIS — M5127 Other intervertebral disc displacement, lumbosacral region: Secondary | ICD-10-CM | POA: Diagnosis not present

## 2020-03-24 DIAGNOSIS — M62830 Muscle spasm of back: Secondary | ICD-10-CM | POA: Diagnosis not present

## 2020-03-27 DIAGNOSIS — M5127 Other intervertebral disc displacement, lumbosacral region: Secondary | ICD-10-CM | POA: Diagnosis not present

## 2020-03-27 DIAGNOSIS — M9903 Segmental and somatic dysfunction of lumbar region: Secondary | ICD-10-CM | POA: Diagnosis not present

## 2020-03-27 DIAGNOSIS — M62830 Muscle spasm of back: Secondary | ICD-10-CM | POA: Diagnosis not present

## 2020-04-11 ENCOUNTER — Other Ambulatory Visit: Payer: Self-pay

## 2020-04-11 ENCOUNTER — Ambulatory Visit (INDEPENDENT_AMBULATORY_CARE_PROVIDER_SITE_OTHER): Payer: BC Managed Care – PPO | Admitting: Family Medicine

## 2020-04-11 ENCOUNTER — Encounter: Payer: Self-pay | Admitting: Family Medicine

## 2020-04-11 VITALS — BP 158/108 | HR 78 | Temp 98.0°F | Ht 63.75 in | Wt 198.2 lb

## 2020-04-11 DIAGNOSIS — I10 Essential (primary) hypertension: Secondary | ICD-10-CM | POA: Diagnosis not present

## 2020-04-11 LAB — LIPID PANEL
Cholesterol: 200 mg/dL (ref 0–200)
HDL: 38.1 mg/dL — ABNORMAL LOW (ref >39.00)
NonHDL: 162.15
Total CHOL/HDL Ratio: 5
Triglycerides: 325 mg/dL — ABNORMAL HIGH (ref 0.0–149.0)
VLDL: 65 mg/dL — ABNORMAL HIGH (ref 0.0–40.0)

## 2020-04-11 LAB — COMPREHENSIVE METABOLIC PANEL
ALT: 34 U/L (ref 0–35)
AST: 20 U/L (ref 0–37)
Albumin: 4.6 g/dL (ref 3.5–5.2)
Alkaline Phosphatase: 52 U/L (ref 39–117)
BUN: 13 mg/dL (ref 6–23)
CO2: 27 mEq/L (ref 19–32)
Calcium: 9.5 mg/dL (ref 8.4–10.5)
Chloride: 102 mEq/L (ref 96–112)
Creatinine, Ser: 0.7 mg/dL (ref 0.40–1.20)
GFR: 91.68 mL/min (ref >60.00)
Glucose, Bld: 105 mg/dL — ABNORMAL HIGH (ref 70–99)
Potassium: 4.4 mEq/L (ref 3.5–5.1)
Sodium: 137 mEq/L (ref 135–145)
Total Bilirubin: 0.5 mg/dL (ref 0.2–1.2)
Total Protein: 7.9 g/dL (ref 6.0–8.3)

## 2020-04-11 LAB — TSH: TSH: 2.05 u[IU]/mL (ref 0.35–4.50)

## 2020-04-11 LAB — LDL CHOLESTEROL, DIRECT: Direct LDL: 121 mg/dL

## 2020-04-11 NOTE — Patient Instructions (Addendum)
Return in 2 months for physical and recheck blood pressures at that time. No med changes today. Continue monitoring at home, send me readings every few weeks. If home readings start trending up, we may increase benazepril dose.  Labs today.

## 2020-04-11 NOTE — Assessment & Plan Note (Addendum)
Significant improvement based on readings she brings from home although today's readings elevated in office. Tolerating meds well. ?component of white coat hypertension. No med changes at this time. I did ask her to continue monitoring at home and send me readings every few weeks to continue to keep close eye on control. I also asked her to bring home BP cuff to next appt to compare with our readings - ensure accuracy.  Will check labwork today and reassess at CPE in 2 months.

## 2020-04-11 NOTE — Progress Notes (Signed)
This visit was conducted in person.  BP (!) 158/108 (BP Location: Right Arm, Cuff Size: Large)   Pulse 78   Temp 98 F (36.7 C) (Temporal)   Ht 5' 3.75" (1.619 m)   Wt 198 lb 3 oz (89.9 kg)   LMP  (Within Years)   SpO2 99%   BMI 34.29 kg/m   BP Readings from Last 3 Encounters:  04/11/20 (!) 158/108  03/06/20 (!) 170/120  09/26/18 (!) 140/100    CC: HTN f/u visit  Subjective:    Patient ID: Rachel Bailey, female    DOB: Oct 03, 1975, 44 y.o.   MRN: [id number]  HPI: [name] is a 44 y.o. female presenting on 04/11/2020 for Hypertension (Here for 1 mo f/u.)   HTN - Compliant with current antihypertensive regimen of amlodipine 5mg  and benazepril 10mg  daily (h/o BTL - noticing dry cough at night). Does check blood pressures at home: 120-140/80-90, HR 80s. She uses automatic cuff at home. No low blood pressure readings or symptoms of dizziness/syncope. Denies HA, vision changes, CP/tightness, SOB, leg swelling.      Relevant past medical, surgical, family and social history reviewed and updated as indicated. Interim medical history since our last visit reviewed. Allergies and medications reviewed and updated. Outpatient Medications Prior to Visit  Medication Sig Dispense Refill  . amLODipine (NORVASC) 5 MG tablet Take 1 tablet (5 mg total) by mouth daily. 90 tablet 1  . benazepril (LOTENSIN) 10 MG tablet Take 1 tablet (10 mg total) by mouth daily. 30 tablet 6  . amoxicillin (AMOXIL) 875 MG tablet Take 1 tablet (875 mg total) by mouth 2 (two) times daily. 14 tablet 0   No facility-administered medications prior to visit.     Per HPI unless specifically indicated in ROS section below Review of Systems Objective:  BP (!) 158/108 (BP Location: Right Arm, Cuff Size: Large)   Pulse 78   Temp 98 F (36.7 C) (Temporal)   Ht 5' 3.75" (1.619 m)   Wt 198 lb 3 oz (89.9 kg)   LMP  (Within Years)   SpO2 99%   BMI 34.29 kg/m   Wt Readings from Last 3 Encounters:  04/11/20 198 lb 3 oz (89.9  kg)  03/06/20 198 lb 7 oz (90 kg)  09/26/18 190 lb 8 oz (86.4 kg)      Physical Exam Vitals and nursing note reviewed.  Constitutional:      Appearance: Normal appearance. She is not ill-appearing.  Neck:     Thyroid: No thyroid mass, thyromegaly or thyroid tenderness.  Cardiovascular:     Rate and Rhythm: Normal rate and regular rhythm.     Pulses: Normal pulses.     Heart sounds: Normal heart sounds. No murmur heard.   Pulmonary:     Effort: Pulmonary effort is normal. No respiratory distress.     Breath sounds: Normal breath sounds. No wheezing, rhonchi or rales.  Musculoskeletal:     Right lower leg: No edema.     Left lower leg: No edema.  Neurological:     Mental Status: She is alert.  Psychiatric:        Mood and Affect: Mood normal.        Behavior: Behavior normal.       Assessment & Plan:  This visit occurred during the SARS-CoV-2 public health emergency.  Safety protocols were in place, including screening questions prior to the visit, additional usage of staff PPE, and extensive cleaning of exam room while observing  appropriate contact time as indicated for disinfecting solutions.   Problem List Items Addressed This Visit    Hypertension - Primary    Significant improvement based on readings she brings from home although today's readings elevated in office. Tolerating meds well. ?component of white coat hypertension. No med changes at this time. I did ask her to continue monitoring at home and send me readings every few weeks to continue to keep close eye on control. I also asked her to bring home BP cuff to next appt to compare with our readings - ensure accuracy.  Will check labwork today and reassess at CPE in 2 months.       Relevant Orders   Comprehensive metabolic panel   Lipid panel   TSH       No orders of the defined types were placed in this encounter.  Orders Placed This Encounter  Procedures  . Comprehensive metabolic panel  . Lipid panel  .  TSH    Patient Instructions  Return in 2 months for physical and recheck blood pressures at that time. No med changes today. Continue monitoring at home, send me readings every few weeks. If home readings start trending up, we may increase benazepril dose.  Labs today.    Follow up plan: No follow-ups on file.  Eustaquio Boyden, MD

## 2020-04-14 DIAGNOSIS — M5127 Other intervertebral disc displacement, lumbosacral region: Secondary | ICD-10-CM | POA: Diagnosis not present

## 2020-04-14 DIAGNOSIS — M62830 Muscle spasm of back: Secondary | ICD-10-CM | POA: Diagnosis not present

## 2020-04-14 DIAGNOSIS — M9903 Segmental and somatic dysfunction of lumbar region: Secondary | ICD-10-CM | POA: Diagnosis not present

## 2020-04-17 ENCOUNTER — Encounter: Payer: Self-pay | Admitting: Family Medicine

## 2020-04-18 NOTE — Telephone Encounter (Signed)
Spoke with pt asking about amlodipine 5 mg.  Confirms she is.  I relayed Dr. Lianne Bushy message.  Pt verbalizes understanding.  She will let us know how that works.

## 2020-04-18 NOTE — Telephone Encounter (Signed)
Will you address in Dr. G's absence? 

## 2020-04-18 NOTE — Telephone Encounter (Signed)
Please check with patient.  Verify patient is currently taking amlodipine 5mg  a day.   If the assumption is that the cough is related to benazepril, then I would stop that for now and increase amlodipine to 10mg  a day.  Have her update about BP and cough next week.  Thanks.

## 2020-04-28 DIAGNOSIS — M9902 Segmental and somatic dysfunction of thoracic region: Secondary | ICD-10-CM | POA: Diagnosis not present

## 2020-04-28 DIAGNOSIS — M5032 Other cervical disc degeneration, mid-cervical region, unspecified level: Secondary | ICD-10-CM | POA: Diagnosis not present

## 2020-04-28 DIAGNOSIS — M5136 Other intervertebral disc degeneration, lumbar region: Secondary | ICD-10-CM | POA: Diagnosis not present

## 2020-04-28 DIAGNOSIS — M9903 Segmental and somatic dysfunction of lumbar region: Secondary | ICD-10-CM | POA: Diagnosis not present

## 2020-04-29 DIAGNOSIS — M9903 Segmental and somatic dysfunction of lumbar region: Secondary | ICD-10-CM | POA: Diagnosis not present

## 2020-04-29 DIAGNOSIS — M5136 Other intervertebral disc degeneration, lumbar region: Secondary | ICD-10-CM | POA: Diagnosis not present

## 2020-04-29 DIAGNOSIS — M5032 Other cervical disc degeneration, mid-cervical region, unspecified level: Secondary | ICD-10-CM | POA: Diagnosis not present

## 2020-04-29 DIAGNOSIS — M9902 Segmental and somatic dysfunction of thoracic region: Secondary | ICD-10-CM | POA: Diagnosis not present

## 2020-04-29 MED ORDER — LOSARTAN POTASSIUM 50 MG PO TABS: 50.0000 mg | ORAL_TABLET | Freq: Every day | ORAL | 6 refills | Status: DC

## 2020-04-29 NOTE — Addendum Note (Signed)
Addended by: Eustaquio Boyden on: 04/29/2020 05:07 PM   Modules accepted: Orders

## 2020-04-29 NOTE — Telephone Encounter (Signed)
Responded to mychart message

## 2020-05-06 DIAGNOSIS — M5032 Other cervical disc degeneration, mid-cervical region, unspecified level: Secondary | ICD-10-CM | POA: Diagnosis not present

## 2020-05-06 DIAGNOSIS — M9903 Segmental and somatic dysfunction of lumbar region: Secondary | ICD-10-CM | POA: Diagnosis not present

## 2020-05-06 DIAGNOSIS — M9902 Segmental and somatic dysfunction of thoracic region: Secondary | ICD-10-CM | POA: Diagnosis not present

## 2020-05-06 DIAGNOSIS — M5136 Other intervertebral disc degeneration, lumbar region: Secondary | ICD-10-CM | POA: Diagnosis not present

## 2020-05-07 DIAGNOSIS — M9903 Segmental and somatic dysfunction of lumbar region: Secondary | ICD-10-CM | POA: Diagnosis not present

## 2020-05-07 DIAGNOSIS — M5032 Other cervical disc degeneration, mid-cervical region, unspecified level: Secondary | ICD-10-CM | POA: Diagnosis not present

## 2020-05-07 DIAGNOSIS — M9902 Segmental and somatic dysfunction of thoracic region: Secondary | ICD-10-CM | POA: Diagnosis not present

## 2020-05-07 DIAGNOSIS — M5136 Other intervertebral disc degeneration, lumbar region: Secondary | ICD-10-CM | POA: Diagnosis not present

## 2020-05-12 DIAGNOSIS — M9903 Segmental and somatic dysfunction of lumbar region: Secondary | ICD-10-CM | POA: Diagnosis not present

## 2020-05-12 DIAGNOSIS — M5136 Other intervertebral disc degeneration, lumbar region: Secondary | ICD-10-CM | POA: Diagnosis not present

## 2020-05-12 DIAGNOSIS — M9902 Segmental and somatic dysfunction of thoracic region: Secondary | ICD-10-CM | POA: Diagnosis not present

## 2020-05-12 DIAGNOSIS — M5032 Other cervical disc degeneration, mid-cervical region, unspecified level: Secondary | ICD-10-CM | POA: Diagnosis not present

## 2020-05-14 DIAGNOSIS — M5136 Other intervertebral disc degeneration, lumbar region: Secondary | ICD-10-CM | POA: Diagnosis not present

## 2020-05-14 DIAGNOSIS — M9902 Segmental and somatic dysfunction of thoracic region: Secondary | ICD-10-CM | POA: Diagnosis not present

## 2020-05-14 DIAGNOSIS — M9903 Segmental and somatic dysfunction of lumbar region: Secondary | ICD-10-CM | POA: Diagnosis not present

## 2020-05-14 DIAGNOSIS — M5032 Other cervical disc degeneration, mid-cervical region, unspecified level: Secondary | ICD-10-CM | POA: Diagnosis not present

## 2020-05-19 DIAGNOSIS — M9902 Segmental and somatic dysfunction of thoracic region: Secondary | ICD-10-CM | POA: Diagnosis not present

## 2020-05-19 DIAGNOSIS — M9903 Segmental and somatic dysfunction of lumbar region: Secondary | ICD-10-CM | POA: Diagnosis not present

## 2020-05-19 DIAGNOSIS — M5136 Other intervertebral disc degeneration, lumbar region: Secondary | ICD-10-CM | POA: Diagnosis not present

## 2020-05-19 DIAGNOSIS — M5032 Other cervical disc degeneration, mid-cervical region, unspecified level: Secondary | ICD-10-CM | POA: Diagnosis not present

## 2020-05-21 DIAGNOSIS — M5032 Other cervical disc degeneration, mid-cervical region, unspecified level: Secondary | ICD-10-CM | POA: Diagnosis not present

## 2020-05-21 DIAGNOSIS — M9903 Segmental and somatic dysfunction of lumbar region: Secondary | ICD-10-CM | POA: Diagnosis not present

## 2020-05-21 DIAGNOSIS — M9902 Segmental and somatic dysfunction of thoracic region: Secondary | ICD-10-CM | POA: Diagnosis not present

## 2020-05-21 DIAGNOSIS — M5136 Other intervertebral disc degeneration, lumbar region: Secondary | ICD-10-CM | POA: Diagnosis not present

## 2020-05-26 DIAGNOSIS — M5032 Other cervical disc degeneration, mid-cervical region, unspecified level: Secondary | ICD-10-CM | POA: Diagnosis not present

## 2020-05-26 DIAGNOSIS — M9903 Segmental and somatic dysfunction of lumbar region: Secondary | ICD-10-CM | POA: Diagnosis not present

## 2020-05-26 DIAGNOSIS — M5136 Other intervertebral disc degeneration, lumbar region: Secondary | ICD-10-CM | POA: Diagnosis not present

## 2020-05-26 DIAGNOSIS — M9902 Segmental and somatic dysfunction of thoracic region: Secondary | ICD-10-CM | POA: Diagnosis not present

## 2020-05-28 DIAGNOSIS — M9903 Segmental and somatic dysfunction of lumbar region: Secondary | ICD-10-CM | POA: Diagnosis not present

## 2020-05-28 DIAGNOSIS — M5136 Other intervertebral disc degeneration, lumbar region: Secondary | ICD-10-CM | POA: Diagnosis not present

## 2020-05-28 DIAGNOSIS — M9902 Segmental and somatic dysfunction of thoracic region: Secondary | ICD-10-CM | POA: Diagnosis not present

## 2020-05-28 DIAGNOSIS — M5032 Other cervical disc degeneration, mid-cervical region, unspecified level: Secondary | ICD-10-CM | POA: Diagnosis not present

## 2020-06-02 DIAGNOSIS — M5136 Other intervertebral disc degeneration, lumbar region: Secondary | ICD-10-CM | POA: Diagnosis not present

## 2020-06-02 DIAGNOSIS — M9902 Segmental and somatic dysfunction of thoracic region: Secondary | ICD-10-CM | POA: Diagnosis not present

## 2020-06-02 DIAGNOSIS — M9903 Segmental and somatic dysfunction of lumbar region: Secondary | ICD-10-CM | POA: Diagnosis not present

## 2020-06-02 DIAGNOSIS — M5032 Other cervical disc degeneration, mid-cervical region, unspecified level: Secondary | ICD-10-CM | POA: Diagnosis not present

## 2020-06-04 DIAGNOSIS — M5136 Other intervertebral disc degeneration, lumbar region: Secondary | ICD-10-CM | POA: Diagnosis not present

## 2020-06-04 DIAGNOSIS — M9903 Segmental and somatic dysfunction of lumbar region: Secondary | ICD-10-CM | POA: Diagnosis not present

## 2020-06-04 DIAGNOSIS — M5032 Other cervical disc degeneration, mid-cervical region, unspecified level: Secondary | ICD-10-CM | POA: Diagnosis not present

## 2020-06-04 DIAGNOSIS — M9902 Segmental and somatic dysfunction of thoracic region: Secondary | ICD-10-CM | POA: Diagnosis not present

## 2020-06-18 ENCOUNTER — Ambulatory Visit (INDEPENDENT_AMBULATORY_CARE_PROVIDER_SITE_OTHER): Payer: BC Managed Care – PPO | Admitting: Family Medicine

## 2020-06-18 ENCOUNTER — Other Ambulatory Visit: Payer: Self-pay

## 2020-06-18 ENCOUNTER — Encounter: Payer: Self-pay | Admitting: Family Medicine

## 2020-06-18 VITALS — BP 130/100 | HR 81 | Temp 97.7°F | Ht 64.0 in | Wt 197.2 lb

## 2020-06-18 DIAGNOSIS — R739 Hyperglycemia, unspecified: Secondary | ICD-10-CM | POA: Diagnosis not present

## 2020-06-18 DIAGNOSIS — E669 Obesity, unspecified: Secondary | ICD-10-CM

## 2020-06-18 DIAGNOSIS — I10 Essential (primary) hypertension: Secondary | ICD-10-CM

## 2020-06-18 DIAGNOSIS — N926 Irregular menstruation, unspecified: Secondary | ICD-10-CM | POA: Diagnosis not present

## 2020-06-18 DIAGNOSIS — Z0000 Encounter for general adult medical examination without abnormal findings: Secondary | ICD-10-CM

## 2020-06-18 DIAGNOSIS — E785 Hyperlipidemia, unspecified: Secondary | ICD-10-CM | POA: Diagnosis not present

## 2020-06-18 DIAGNOSIS — E282 Polycystic ovarian syndrome: Secondary | ICD-10-CM

## 2020-06-18 LAB — BASIC METABOLIC PANEL
BUN: 15 mg/dL (ref 6–23)
CO2: 30 mEq/L (ref 19–32)
Calcium: 9.6 mg/dL (ref 8.4–10.5)
Chloride: 101 mEq/L (ref 96–112)
Creatinine, Ser: 0.72 mg/dL (ref 0.40–1.20)
GFR: 103.01 mL/min (ref >60.00)
Glucose, Bld: 101 mg/dL — ABNORMAL HIGH (ref 70–99)
Potassium: 4.6 mEq/L (ref 3.5–5.1)
Sodium: 137 mEq/L (ref 135–145)

## 2020-06-18 LAB — LIPID PANEL
Cholesterol: 205 mg/dL — ABNORMAL HIGH (ref 0–200)
HDL: 36.8 mg/dL — ABNORMAL LOW (ref >39.00)
NonHDL: 168.17
Total CHOL/HDL Ratio: 6
Triglycerides: 324 mg/dL — ABNORMAL HIGH (ref 0.0–149.0)
VLDL: 64.8 mg/dL — ABNORMAL HIGH (ref 0.0–40.0)

## 2020-06-18 LAB — LDL CHOLESTEROL, DIRECT: Direct LDL: 130 mg/dL

## 2020-06-18 LAB — HEMOGLOBIN A1C: Hgb A1c MFr Bld: 6 % (ref 4.6–6.5)

## 2020-06-18 MED ORDER — LOSARTAN POTASSIUM 100 MG PO TABS: 100.0000 mg | ORAL_TABLET | Freq: Every day | ORAL | 3 refills | Status: DC

## 2020-06-18 NOTE — Progress Notes (Signed)
This visit was conducted in person.  BP (!) 130/100 (BP Location: Right Arm, Patient Position: Sitting, Cuff Size: Large)   Pulse 81   Temp 97.7 F (36.5 C) (Temporal)   Ht 5\' 4"  (1.626 m)   Wt 197 lb 4 oz (89.5 kg)   LMP  (Within Years)   SpO2 99%   BMI 33.86 kg/m    CC: CPE Subjective:    Patient ID: , female    DOB: [birth year], 44 y.o.   MRN: [id number]  HPI: Rachel Bailey is a 44 y.o. female presenting on 06/18/2020 for Annual Exam (Pt brought home BP monitor to compare.  Reading in office is 145/105. )   HTN - Compliant with current antihypertensive regimen of amlodipine 5mg  daily, losartan 50mg  daily. Does check blood pressures at home: averaging 130/90-100. No low blood pressure readings or symptoms of dizziness/syncope. Denies HA, vision changes, CP/tightness, SOB, leg swelling. Brings home cuff which actually had higher readings than in office today.   PCOS - previously used metformin XR for fertility.   Preventative: Well woman exam - OBGYN in [facility] (last seen 2019)  LMP - 5 years ago  Breast cancer screening - mammo done 2019. Planning to repeat this year  DEXA scan - not due Flu shot - declines - will get at [facility]  Tdap 2015 COVID vaccine - Pfizer 11/2019, 12/2019, planning booster Pneumonia shot - not due Shingrix - not due  Advanced directive discussion -  Seat belt use discussed  Sunscreen use discussed. No changing moles on skin. Non smoker  Alcohol  -  none Dentist - q6 mo  Eye exam - yearly   Caffeine: 2 cups coffee/day  Lives with husband, son (2005), 2 twins (2012), outdoor 01/2020  Occupation: [birth year]-10-1975 at [employer]  Edu: BS in business administration  Activity: started walking 3x/wk on treadmill for 20 min Diet: good water, fruits/vegetables daily      Relevant past medical, surgical, family and social history reviewed and updated as indicated. Interim medical history since our last visit reviewed. Allergies and medications  reviewed and updated. Outpatient Medications Prior to Visit  Medication Sig Dispense Refill  . amLODipine (NORVASC) 5 MG tablet Take 1 tablet (5 mg total) by mouth daily. 90 tablet 1  . losartan (COZAAR) 50 MG tablet Take 1 tablet (50 mg total) by mouth daily. 30 tablet 6   No facility-administered medications prior to visit.     Per HPI unless specifically indicated in ROS section below Review of Systems  Constitutional: Negative for activity change, appetite change, chills, fatigue, fever and unexpected weight change.  HENT: Negative for hearing loss.   Eyes: Negative for visual disturbance.  Respiratory: Negative for cough, chest tightness, shortness of breath and wheezing.   Cardiovascular: Positive for chest pain (R sided yesterday, now resolved). Negative for palpitations and leg swelling.  Gastrointestinal: Negative for abdominal distention, abdominal pain, blood in stool, constipation, diarrhea, nausea and vomiting.  Genitourinary: Negative for difficulty urinating and hematuria.  Musculoskeletal: Negative for arthralgias, myalgias and neck pain.  Skin: Negative for rash.  Neurological: Negative for dizziness, seizures, syncope and headaches.  Hematological: Negative for adenopathy. Does not bruise/bleed easily.  Psychiatric/Behavioral: Negative for dysphoric mood. The patient is not nervous/anxious.    Objective:  BP (!) 130/100 (BP Location: Right Arm, Patient Position: Sitting, Cuff Size: Large)   Pulse 81   Temp 97.7 F (36.5 C) (Temporal)   Ht 5\' 4"  (1.626 m)   Wt  197 lb 4 oz (89.5 kg)   LMP  (Within Years)   SpO2 99%   BMI 33.86 kg/m   Wt Readings from Last 3 Encounters:  06/18/20 197 lb 4 oz (89.5 kg)  04/11/20 198 lb 3 oz (89.9 kg)  03/06/20 198 lb 7 oz (90 kg)      Physical Exam Vitals and nursing note reviewed.  Constitutional:      General: She is not in acute distress.    Appearance: Normal appearance. She is well-developed. She is not ill-appearing.   HENT:     Head: Normocephalic and atraumatic.     Right Ear: Hearing, tympanic membrane, ear canal and external ear normal.     Left Ear: Hearing, tympanic membrane, ear canal and external ear normal.     Nose: Nose normal.  Eyes:     General: No scleral icterus.    Conjunctiva/sclera: Conjunctivae normal.     Pupils: Pupils are equal, round, and reactive to light.  Neck:     Thyroid: No thyroid mass or thyromegaly.  Cardiovascular:     Rate and Rhythm: Normal rate and regular rhythm.     Pulses: Normal pulses.          Radial pulses are 2+ on the right side and 2+ on the left side.     Heart sounds: Normal heart sounds. No murmur heard.   Pulmonary:     Effort: Pulmonary effort is normal. No respiratory distress.     Breath sounds: Normal breath sounds. No wheezing, rhonchi or rales.  Abdominal:     General: Abdomen is flat. Bowel sounds are normal. There is no distension.     Palpations: Abdomen is soft. There is no mass.     Tenderness: There is no abdominal tenderness. There is no guarding or rebound.     Hernia: No hernia is present.  Musculoskeletal:        General: Normal range of motion.     Cervical back: Normal range of motion and neck supple.     Right lower leg: No edema.     Left lower leg: No edema.  Lymphadenopathy:     Cervical: No cervical adenopathy.  Skin:    General: Skin is warm and dry.     Findings: No rash.  Neurological:     General: No focal deficit present.     Mental Status: She is alert and oriented to person, place, and time.     Comments: CN grossly intact, station and gait intact  Psychiatric:        Mood and Affect: Mood normal.        Behavior: Behavior normal.        Thought Content: Thought content normal.        Judgment: Judgment normal.       Results for orders placed or performed in visit on 04/11/20  Comprehensive metabolic panel  Result Value Ref Range   Sodium 137 135 - 145 mEq/L   Potassium 4.4 3.5 - 5.1 mEq/L    Chloride 102 96 - 112 mEq/L   CO2 27 19 - 32 mEq/L   Glucose, Bld 105 (H) 70 - 99 mg/dL   BUN 13 6 - 23 mg/dL   Creatinine, Ser 4.09 0.40 - 1.20 mg/dL   Total Bilirubin 0.5 0.2 - 1.2 mg/dL   Alkaline Phosphatase 52 39 - 117 U/L   AST 20 0 - 37 U/L   ALT 34 0 - 35 U/L  Total Protein 7.9 6.0 - 8.3 g/dL   Albumin 4.6 3.5 - 5.2 g/dL   GFR 93.71 >69.67 mL/min   Calcium 9.5 8.4 - 10.5 mg/dL  Lipid panel  Result Value Ref Range   Cholesterol 200 0 - 200 mg/dL   Triglycerides 893.8 (H) 0 - 149 mg/dL   HDL 10.17 (L) >51.02 mg/dL   VLDL 58.5 (H) 0.0 - 27.7 mg/dL   Total CHOL/HDL Ratio 5    NonHDL 162.15   TSH  Result Value Ref Range   TSH 2.05 0.35 - 4.50 uIU/mL  LDL cholesterol, direct  Result Value Ref Range   Direct LDL 121.0 mg/dL   Assessment & Plan:  This visit occurred during the SARS-CoV-2 public health emergency.  Safety protocols were in place, including screening questions prior to the visit, additional usage of staff PPE, and extensive cleaning of exam room while observing appropriate contact time as indicated for disinfecting solutions.   Problem List Items Addressed This Visit    PCOS (polycystic ovarian syndrome)    S/p BTL. Rare periods       Obesity, Class I, BMI 30-34.9    Encouraged healthy diet and lifestyle changes to affect sustainable weight loss. She is motivated to start regular walking routine (20 min on treadmill 3x/wk).       Irregular periods    LMP 5 yrs ago.       Hypertension    Chronic, uncontrolled. Will change bp regimen to losartan 100mg  daily - with option to restart amlodipine 5mg  if BP staying uncontrolled only on ARB. RTC 6 mo f/u visit, update sooner if persistently uncontrolled.       Relevant Medications   losartan (COZAAR) 100 MG tablet   Healthcare maintenance - Primary    Preventative protocols reviewed and updated unless pt declined. Discussed healthy diet and lifestyle.       Dyslipidemia    Chronic, triglycerides elevated  - update fasting labs today.  The 10-year ASCVD risk score DC ., et al., 2013) is: 1.8%   Values used to calculate the score:     Age: 51 years     Sex: Female     Is Non-Hispanic African American: No     Diabetic: No     Tobacco smoker: No     Systolic Blood Pressure: 130 mmHg     Is BP treated: Yes     HDL Cholesterol: 38.1 mg/dL     Total Cholesterol: 200 mg/dL       Relevant Orders   Lipid panel    Other Visit Diagnoses    Hyperglycemia       Relevant Orders   Hemoglobin A1c   Basic metabolic panel       Meds ordered this encounter  Medications  . losartan (COZAAR) 100 MG tablet    Sig: Take 1 tablet (100 mg total) by mouth daily.    Dispense:  90 tablet    Refill:  3    Note new sig   Orders Placed This Encounter  Procedures  . Lipid panel  . Hemoglobin A1c  . Basic metabolic panel    Patient instructions: Blood pressure is staying elevated. Increase losartan to 100mg  daily - try this alone first, and if needed for blood pressure control, add back on amlodipine 5mg  daily.  Labs today  You are doing well today.  Return as needed or in 6 months for blood pressure follow up, let me know sooner if any trouble or  readings staying too high >140/90.   Follow up plan: Return in about 6 months (around 12/17/2020), or if symptoms worsen or fail to improve, for follow up visit.  Eustaquio BoydenJavier Ciarra Braddy, MD

## 2020-06-18 NOTE — Assessment & Plan Note (Signed)
Chronic, triglycerides elevated - update fasting labs today.  The 10-year ASCVD risk score Denman George DC Montez Hageman., et al., 2013) is: 1.8%   Values used to calculate the score:     Age: 44 years     Sex: Female     Is Non-Hispanic African American: No     Diabetic: No     Tobacco smoker: No     Systolic Blood Pressure: 130 mmHg     Is BP treated: Yes     HDL Cholesterol: 38.1 mg/dL     Total Cholesterol: 200 mg/dL

## 2020-06-18 NOTE — Assessment & Plan Note (Signed)
Chronic, uncontrolled. Will change bp regimen to losartan 100mg  daily - with option to restart amlodipine 5mg  if BP staying uncontrolled only on ARB. RTC 6 mo f/u visit, update sooner if persistently uncontrolled.

## 2020-06-18 NOTE — Assessment & Plan Note (Signed)
Encouraged healthy diet and lifestyle changes to affect sustainable weight loss. She is motivated to start regular walking routine (20 min on treadmill 3x/wk).

## 2020-06-18 NOTE — Assessment & Plan Note (Signed)
Preventative protocols reviewed and updated unless pt declined. Discussed healthy diet and lifestyle.  

## 2020-06-18 NOTE — Patient Instructions (Addendum)
Blood pressure is staying elevated. Increase losartan to 100mg  daily - try this alone first, and if needed for blood pressure control, add back on amlodipine 5mg  daily.  Labs today  You are doing well today.  Return as needed or in 6 months for blood pressure follow up, let me know sooner if any trouble or readings staying too high >140/90.   Health Maintenance, Female Adopting a healthy lifestyle and getting preventive care are important in promoting health and wellness. Ask your health care provider about:  The right schedule for you to have regular tests and exams.  Things you can do on your own to prevent diseases and keep yourself healthy. What should I know about diet, weight, and exercise? Eat a healthy diet   Eat a diet that includes plenty of vegetables, fruits, low-fat dairy products, and lean protein.  Do not eat a lot of foods that are high in solid fats, added sugars, or sodium. Maintain a healthy weight Body mass index (BMI) is used to identify weight problems. It estimates body fat based on height and weight. Your health care provider can help determine your BMI and help you achieve or maintain a healthy weight. Get regular exercise Get regular exercise. This is one of the most important things you can do for your health. Most adults should:  Exercise for at least 150 minutes each week. The exercise should increase your heart rate and make you sweat (moderate-intensity exercise).  Do strengthening exercises at least twice a week. This is in addition to the moderate-intensity exercise.  Spend less time sitting. Even light physical activity can be beneficial. Watch cholesterol and blood lipids Have your blood tested for lipids and cholesterol at 42 years of age, then have this test every 5 years. Have your cholesterol levels checked more often if:  Your lipid or cholesterol levels are high.  You are older than 42 years of age.  You are at high risk for heart  disease. What should I know about cancer screening? Depending on your health history and family history, you may need to have cancer screening at various ages. This may include screening for:  Breast cancer.  Cervical cancer.  Colorectal cancer.  Skin cancer.  Lung cancer. What should I know about heart disease, diabetes, and high blood pressure? Blood pressure and heart disease  High blood pressure causes heart disease and increases the risk of stroke. This is more likely to develop in people who have high blood pressure readings, are of African descent, or are overweight.  Have your blood pressure checked: ? Every 3-5 years if you are 5-9 years of age. ? Every year if you are 58 years old or older. Diabetes Have regular diabetes screenings. This checks your fasting blood sugar level. Have the screening done:  Once every three years after age 23 if you are at a normal weight and have a low risk for diabetes.  More often and at a younger age if you are overweight or have a high risk for diabetes. What should I know about preventing infection? Hepatitis B If you have a higher risk for hepatitis B, you should be screened for this virus. Talk with your health care provider to find out if you are at risk for hepatitis B infection. Hepatitis C Testing is recommended for:  Everyone born from [birth year] through 1965.  Anyone with known risk factors for hepatitis C. Sexually transmitted infections (STIs)  Get screened for STIs, including gonorrhea and chlamydia, if: ?  You are sexually active and are younger than 42 years of age. ? You are older than 42 years of age and your health care provider tells you that you are at risk for this type of infection. ? Your sexual activity has changed since you were last screened, and you are at increased risk for chlamydia or gonorrhea. Ask your health care provider if you are at risk.  Ask your health care provider about whether you are at high  risk for HIV. Your health care provider may recommend a prescription medicine to help prevent HIV infection. If you choose to take medicine to prevent HIV, you should first get tested for HIV. You should then be tested every 3 months for as long as you are taking the medicine. Pregnancy  If you are about to stop having your period (premenopausal) and you may become pregnant, seek counseling before you get pregnant.  Take 400 to 800 micrograms (mcg) of folic acid every day if you become pregnant.  Ask for birth control (contraception) if you want to prevent pregnancy. Osteoporosis and menopause Osteoporosis is a disease in which the bones lose minerals and strength with aging. This can result in bone fractures. If you are 41 years old or older, or if you are at risk for osteoporosis and fractures, ask your health care provider if you should:  Be screened for bone loss.  Take a calcium or vitamin D supplement to lower your risk of fractures.  Be given hormone replacement therapy (HRT) to treat symptoms of menopause. Follow these instructions at home: Lifestyle  Do not use any products that contain nicotine or tobacco, such as cigarettes, e-cigarettes, and chewing tobacco. If you need help quitting, ask your health care provider.  Do not use street drugs.  Do not share needles.  Ask your health care provider for help if you need support or information about quitting drugs. Alcohol use  Do not drink alcohol if: ? Your health care provider tells you not to drink. ? You are pregnant, may be pregnant, or are planning to become pregnant.  If you drink alcohol: ? Limit how much you use to 0-1 drink a day. ? Limit intake if you are breastfeeding.  Be aware of how much alcohol is in your drink. In the [place] one drink equals one 12 oz bottle of beer (355 mL), one 5 oz glass of wine (148 mL), or one 1 oz glass of hard liquor (44 mL). General instructions  Schedule regular health, dental,  and eye exams.  Stay current with your vaccines.  Tell your health care provider if: ? You often feel depressed. ? You have ever been abused or do not feel safe at home. Summary  Adopting a healthy lifestyle and getting preventive care are important in promoting health and wellness.  Follow your health care provider's instructions about healthy diet, exercising, and getting tested or screened for diseases.  Follow your health care provider's instructions on monitoring your cholesterol and blood pressure. This information is not intended to replace advice given to you by your health care provider. Make sure you discuss any questions you have with your health care provider. Document Revised: 08/09/2018 Document Reviewed: 08/09/2018 Elsevier Patient Education  2020 [employer].

## 2020-06-18 NOTE — Assessment & Plan Note (Signed)
LMP 5 yrs ago.

## 2020-06-18 NOTE — Assessment & Plan Note (Signed)
S/p BTL. Rare periods

## 2020-06-20 ENCOUNTER — Encounter: Payer: Self-pay | Admitting: Family Medicine

## 2020-06-20 DIAGNOSIS — E118 Type 2 diabetes mellitus with unspecified complications: Secondary | ICD-10-CM | POA: Insufficient documentation

## 2020-06-20 DIAGNOSIS — R7303 Prediabetes: Secondary | ICD-10-CM | POA: Insufficient documentation

## 2020-06-20 DIAGNOSIS — E1169 Type 2 diabetes mellitus with other specified complication: Secondary | ICD-10-CM | POA: Insufficient documentation

## 2020-06-25 ENCOUNTER — Encounter: Payer: Self-pay | Admitting: Family Medicine

## 2020-06-26 ENCOUNTER — Encounter: Payer: Self-pay | Admitting: Family Medicine

## 2020-07-31 ENCOUNTER — Encounter: Payer: Self-pay | Admitting: Family Medicine

## 2020-07-31 MED ORDER — FLUCONAZOLE 150 MG PO TABS: 150.0000 mg | ORAL_TABLET | Freq: Once | ORAL | 0 refills | Status: AC

## 2020-07-31 NOTE — Addendum Note (Signed)
Addended by: Eustaquio Boyden on: 07/31/2020 12:59 PM   Modules accepted: Orders

## 2020-07-31 NOTE — Telephone Encounter (Signed)
Spoke with pt offering an OV with an available provider, since Dr. Reece Agar is out of the office.  Pt declines stating, "It would just be easier if he [Dr. G] would send something or recommend something."  Plz advise.

## 2020-08-07 ENCOUNTER — Other Ambulatory Visit: Payer: Self-pay | Admitting: Family Medicine

## 2020-08-07 ENCOUNTER — Encounter: Payer: Self-pay | Admitting: Family Medicine

## 2020-08-11 ENCOUNTER — Encounter: Payer: Self-pay | Admitting: Family Medicine

## 2020-09-10 ENCOUNTER — Encounter: Payer: Self-pay | Admitting: Family Medicine

## 2020-09-11 NOTE — Telephone Encounter (Signed)
Rachel Bailey was able to get scheduled tomorrow at 12:30 with Dr. Alphonsus Sias.

## 2020-09-11 NOTE — Telephone Encounter (Signed)
Will you plz try to get pt scheduled with an available provider today for possible UTI?

## 2020-09-11 NOTE — Telephone Encounter (Signed)
We unfortunately don't have any openings today.  I did put her in for tomorrow. I will call her to let her know. I

## 2020-09-11 NOTE — Telephone Encounter (Signed)
Called and got her vm. I left a vm for her to call the office to let her know about the appt.

## 2020-09-12 ENCOUNTER — Encounter: Payer: Self-pay | Admitting: Internal Medicine

## 2020-09-12 ENCOUNTER — Ambulatory Visit (INDEPENDENT_AMBULATORY_CARE_PROVIDER_SITE_OTHER): Payer: BC Managed Care – PPO | Admitting: Internal Medicine

## 2020-09-12 ENCOUNTER — Other Ambulatory Visit: Payer: Self-pay

## 2020-09-12 VITALS — BP 104/60 | HR 90 | Temp 97.5°F | Ht 64.0 in | Wt 201.0 lb

## 2020-09-12 DIAGNOSIS — N3000 Acute cystitis without hematuria: Secondary | ICD-10-CM | POA: Diagnosis not present

## 2020-09-12 DIAGNOSIS — R300 Dysuria: Secondary | ICD-10-CM

## 2020-09-12 LAB — POC URINALSYSI DIPSTICK (AUTOMATED)
Bilirubin, UA: NEGATIVE
Glucose, UA: NEGATIVE
Ketones, UA: NEGATIVE
Nitrite, UA: NEGATIVE
Protein, UA: POSITIVE — AB
Spec Grav, UA: 1.02 (ref 1.010–1.025)
Urobilinogen, UA: 0.2 E.U./dL
pH, UA: 6 (ref 5.0–8.0)

## 2020-09-12 MED ORDER — NITROFURANTOIN MONOHYD MACRO 100 MG PO CAPS: 100.0000 mg | ORAL_CAPSULE | Freq: Two times a day (BID) | ORAL | 1 refills | Status: DC

## 2020-09-12 NOTE — Progress Notes (Signed)
Subjective:    Patient ID: Rachel Bailey, female    DOB: December 27, 1975, 45 y.o.   MRN: [id number]  HPI Here due to burning with urination This visit occurred during the SARS-CoV-2 public health emergency.  Safety protocols were in place, including screening questions prior to the visit, additional usage of staff PPE, and extensive cleaning of exam room while observing appropriate contact time as indicated for disinfecting solutions.   Intermittent sharp pain when voiding Noted about 5 days ago Not every day---yesterday fine but recurred this morning Strong/weird odor---did try to flush out with more water No blood  Some urgency  Has had past UTIs---even hospitalized once No regular periods---none in 3 years No problems post coital  Current Outpatient Medications on File Prior to Visit  Medication Sig Dispense Refill  . amLODipine (NORVASC) 5 MG tablet TAKE 1 TABLET BY MOUTH EVERY DAY 90 tablet 0  . losartan (COZAAR) 100 MG tablet Take 1 tablet (100 mg total) by mouth daily. 90 tablet 3   No current facility-administered medications on file prior to visit.    Allergies  Allergen Reactions  . Ciprofloxacin Other (See Comments)    Headaches, bodyaches    Past Medical History:  Diagnosis Date  . Acne   . Acute pyelonephritis 10/09/2014  . Dyslipidemia   . History of chicken pox   . History of pyelonephritis multiple  . Irregular periods   . UTI (urinary tract infection)     Past Surgical History:  Procedure Laterality Date  . DILATION AND CURETTAGE OF UTERUS  1995   due to miscarriage  . EYE SURGERY    . TUBAL LIGATION  2012    Family History  Problem Relation Age of Onset  . Hyperlipidemia Mother   . Hypertension Mother   . Hyperlipidemia Father   . Diabetes Paternal Grandmother   . Stroke Maternal Grandfather        x2  . Cancer Neg Hx   . CAD Neg Hx     Social History   Socioeconomic History  . Marital status: Married    Spouse name: Not on file  . Number  of children: Not on file  . Years of education: Not on file  . Highest education level: Not on file  Occupational History  . Not on file  Tobacco Use  . Smoking status: Never Smoker  . Smokeless tobacco: Never Used  Substance and Sexual Activity  . Alcohol use: No  . Drug use: No  . Sexual activity: Yes  Other Topics Concern  . Not on file  Social History Narrative   Caffeine: 2 cups coffee/day   Lives with husband, son (2005), 2 twins (2012), outdoor Medical laboratory scientific officer   Occupation: Visual merchandiser at [employer]   Edu: BS in business administration   Activity: no regular exercise   Diet: good water, fruits/vegetables daily   Social Determinants of Corporate investment banker Strain: Not on file  Food Insecurity: Not on file  Transportation Needs: Not on file  Physical Activity: Not on file  Stress: Not on file  Social Connections: Not on file  Intimate Partner Violence: Not on file   Review of Systems No new medications No dietary changes No fever Slight nausea--no vomiting Appetite is okay    Objective:   Physical Exam Constitutional:      Appearance: Normal appearance.  Abdominal:     Palpations: Abdomen is soft.     Tenderness: There is no abdominal tenderness. There is no right  CVA tenderness or left CVA tenderness.  Neurological:     Mental Status: She is alert.            Assessment & Plan:

## 2020-09-12 NOTE — Assessment & Plan Note (Signed)
Dip shows leukocytes and blood Classic symptoms of cystitis No systemic symptoms Will treat with macrobid

## 2020-09-12 NOTE — Telephone Encounter (Signed)
Thank you :)

## 2020-10-26 ENCOUNTER — Other Ambulatory Visit: Payer: Self-pay | Admitting: Family Medicine

## 2020-10-27 ENCOUNTER — Encounter: Payer: Self-pay | Admitting: Family Medicine

## 2020-10-27 NOTE — Telephone Encounter (Signed)
Our records show that patient should have refills on Losartan. Called and spoke to [facility] pharmacist at [facility] and was advised that all Losartan doses are now on back order. Pharmacist stated that they need this changed to another medication since they are not able to get any Losartan at this time.

## 2020-10-27 NOTE — Telephone Encounter (Signed)
I've sent in valsartan 80mg  to replace losartan 50mg  due to back order. plz notify patient and have her monitor blood pressures closely with transition.

## 2020-10-28 NOTE — Telephone Encounter (Signed)
Pt aware.  (see Pt Msg, 10/27/20)

## 2020-11-02 ENCOUNTER — Other Ambulatory Visit: Payer: Self-pay | Admitting: Family Medicine

## 2020-11-07 ENCOUNTER — Ambulatory Visit (INDEPENDENT_AMBULATORY_CARE_PROVIDER_SITE_OTHER): Payer: BC Managed Care – PPO | Admitting: Family Medicine

## 2020-11-07 ENCOUNTER — Other Ambulatory Visit: Payer: Self-pay

## 2020-11-07 ENCOUNTER — Encounter: Payer: Self-pay | Admitting: Family Medicine

## 2020-11-07 VITALS — BP 120/82 | HR 96 | Temp 97.8°F | Ht 64.0 in | Wt 200.1 lb

## 2020-11-07 DIAGNOSIS — R3129 Other microscopic hematuria: Secondary | ICD-10-CM | POA: Diagnosis not present

## 2020-11-07 DIAGNOSIS — Z87898 Personal history of other specified conditions: Secondary | ICD-10-CM | POA: Diagnosis not present

## 2020-11-07 DIAGNOSIS — I10 Essential (primary) hypertension: Secondary | ICD-10-CM

## 2020-11-07 LAB — POC URINALSYSI DIPSTICK (AUTOMATED)
Bilirubin, UA: NEGATIVE
Glucose, UA: NEGATIVE
Ketones, UA: NEGATIVE
Leukocytes, UA: NEGATIVE
Nitrite, UA: NEGATIVE
Protein, UA: POSITIVE — AB
Spec Grav, UA: >=1.03 — AB (ref 1.010–1.025)
Urobilinogen, UA: 0.2 E.U./dL
pH, UA: 6 (ref 5.0–8.0)

## 2020-11-07 NOTE — Assessment & Plan Note (Signed)
Chronic, stable. Continue current regimen of ARB and amlodipine.

## 2020-11-07 NOTE — Assessment & Plan Note (Addendum)
2+ RBC on recent UA in setting of UTI Update UA today r/o hematuria - concentrated, some contamination but no obvious hematuria noted. Consider updated UA at CPE.

## 2020-11-07 NOTE — Assessment & Plan Note (Signed)
Discussed topical scopolamine vs oral meclizine use as well as common side effects in anticipation of upcoming cruise.

## 2020-11-07 NOTE — Progress Notes (Signed)
Patient ID: Rachel Bailey, female    DOB: 1976-06-17, 45 y.o.   MRN: [id number]  This visit was conducted in person.  BP 120/82   Pulse 96   Temp 97.8 F (36.6 C) (Temporal)   Ht 5\' 4"  (1.626 m)   Wt 200 lb 2 oz (90.8 kg)   LMP  (Within Years)   SpO2 99%   BMI 34.35 kg/m    CC: 6 mo HTN f/u visit  Subjective:   HPI: Rachel Bailey is a 45 y.o. female presenting on 11/07/2020 for Hypertension (Here for 6 mo f/u.)   HTN - Compliant with current antihypertensive regimen of ARB and amlodipine 5mg  daily. Losartan on recall so valsartan 80mg  sent to pharmacy - she hasn't needed to pick up valsartan yet as had leftover losartan. Does check blood pressures at home: 130/90s. No low blood pressure readings or symptoms of dizziness/syncope. Denies HA, vision changes, CP/tightness, SOB, leg swelling.  Recent UTI treated with macrobid - symptoms fully resolved. She did have 2+ blood on dip stick - will repeat UA. Denies h/o gross hematuria or kidney stones.   Upcoming cruise with husband's family to 01/07/2021. Requests scopolamine patch.      Relevant past medical, surgical, family and social history reviewed and updated as indicated. Interim medical history since our last visit reviewed. Allergies and medications reviewed and updated. Outpatient Medications Prior to Visit  Medication Sig Dispense Refill  . amLODipine (NORVASC) 5 MG tablet TAKE 1 TABLET BY MOUTH EVERY DAY 90 tablet 2  . losartan (COZAAR) 100 MG tablet Take 1 tablet (100 mg total) by mouth daily. 90 tablet 3  . valsartan (DIOVAN) 80 MG tablet Take 1 tablet (80 mg total) by mouth daily. (Patient not taking: Reported on 11/07/2020) 90 tablet 0  . nitrofurantoin, macrocrystal-monohydrate, (MACROBID) 100 MG capsule Take 1 capsule (100 mg total) by mouth 2 (two) times daily. 6 capsule 1   No facility-administered medications prior to visit.     Per HPI unless specifically indicated in ROS section below Review of Systems Objective:  BP  120/82   Pulse 96   Temp 97.8 F (36.6 C) (Temporal)   Ht 5\' 4"  (1.626 m)   Wt 200 lb 2 oz (90.8 kg)   LMP  (Within Years)   SpO2 99%   BMI 34.35 kg/m   Wt Readings from Last 3 Encounters:  11/07/20 200 lb 2 oz (90.8 kg)  09/12/20 201 lb (91.2 kg)  06/18/20 197 lb 4 oz (89.5 kg)      Physical Exam Vitals and nursing note reviewed.  Constitutional:      Appearance: Normal appearance. She is not ill-appearing.  Cardiovascular:     Rate and Rhythm: Normal rate and regular rhythm.     Pulses: Normal pulses.     Heart sounds: Normal heart sounds. No murmur heard.   Pulmonary:     Effort: Pulmonary effort is normal. No respiratory distress.     Breath sounds: Normal breath sounds. No wheezing, rhonchi or rales.  Musculoskeletal:     Right lower leg: No edema.     Left lower leg: No edema.  Skin:    General: Skin is warm.     Findings: No rash.  Neurological:     Mental Status: She is alert.  Psychiatric:        Mood and Affect: Mood normal.        Behavior: Behavior normal.       Results for orders  placed or performed in visit on 11/07/20  POCT Urinalysis Dipstick (Automated)  Result Value Ref Range   Color, UA yellow    Clarity, UA clea    Glucose, UA Negative Negative   Bilirubin, UA negative    Ketones, UA negative    Spec Grav, UA >=1.030 (A) 1.010 - 1.025   Blood, UA 1+    pH, UA 6.0 5.0 - 8.0   Protein, UA Positive (A) Negative   Urobilinogen, UA 0.2 0.2 or 1.0 E.U./dL   Nitrite, UA negative    Leukocytes, UA Negative Negative   Assessment & Plan:  This visit occurred during the SARS-CoV-2 public health emergency.  Safety protocols were in place, including screening questions prior to the visit, additional usage of staff PPE, and extensive cleaning of exam room while observing appropriate contact time as indicated for disinfecting solutions.   Problem List Items Addressed This Visit    Hypertension - Primary    Chronic, stable. Continue current regimen  of ARB and amlodipine.       H/O motion sickness    Discussed topical scopolamine vs oral meclizine use as well as common side effects in anticipation of upcoming cruise.      Microhematuria    2+ RBC on recent UA in setting of UTI Update UA today r/o hematuria - concentrated, some contamination but no obvious hematuria noted. Consider updated UA at CPE.       Relevant Orders   POCT Urinalysis Dipstick (Automated) (Completed)       No orders of the defined types were placed in this encounter.  Orders Placed This Encounter  Procedures  . POCT Urinalysis Dipstick (Automated)    Patient Instructions  Blood pressures are looking better.  Continue current medicines Urinalysis today to ensure no hidden blood in the urine.  Return as needed or in 6 months for physical.    Follow up plan: Return in about 6 months (around 05/10/2021) for annual exam, prior fasting for blood work.  Eustaquio Boyden, MD

## 2020-11-07 NOTE — Patient Instructions (Addendum)
Blood pressures are looking better.  Continue current medicines Urinalysis today to ensure no hidden blood in the urine.  Return as needed or in 6 months for physical.

## 2020-12-13 DIAGNOSIS — Z20822 Contact with and (suspected) exposure to covid-19: Secondary | ICD-10-CM | POA: Diagnosis not present

## 2020-12-17 ENCOUNTER — Ambulatory Visit: Payer: BC Managed Care – PPO | Admitting: Family Medicine

## 2020-12-31 ENCOUNTER — Encounter: Payer: Self-pay | Admitting: Family Medicine

## 2021-04-13 DIAGNOSIS — Z79899 Other long term (current) drug therapy: Secondary | ICD-10-CM | POA: Diagnosis not present

## 2021-04-13 DIAGNOSIS — L649 Androgenic alopecia, unspecified: Secondary | ICD-10-CM | POA: Diagnosis not present

## 2021-05-04 ENCOUNTER — Other Ambulatory Visit: Payer: Self-pay | Admitting: Family Medicine

## 2021-05-04 DIAGNOSIS — E785 Hyperlipidemia, unspecified: Secondary | ICD-10-CM

## 2021-05-04 DIAGNOSIS — E282 Polycystic ovarian syndrome: Secondary | ICD-10-CM

## 2021-05-04 DIAGNOSIS — R7303 Prediabetes: Secondary | ICD-10-CM

## 2021-05-07 ENCOUNTER — Other Ambulatory Visit (INDEPENDENT_AMBULATORY_CARE_PROVIDER_SITE_OTHER): Payer: BC Managed Care – PPO

## 2021-05-07 ENCOUNTER — Other Ambulatory Visit: Payer: Self-pay

## 2021-05-07 DIAGNOSIS — R7303 Prediabetes: Secondary | ICD-10-CM | POA: Diagnosis not present

## 2021-05-07 DIAGNOSIS — E785 Hyperlipidemia, unspecified: Secondary | ICD-10-CM | POA: Diagnosis not present

## 2021-05-07 LAB — COMPREHENSIVE METABOLIC PANEL
ALT: 26 U/L (ref 0–35)
AST: 16 U/L (ref 0–37)
Albumin: 4.4 g/dL (ref 3.5–5.2)
Alkaline Phosphatase: 47 U/L (ref 39–117)
BUN: 14 mg/dL (ref 6–23)
CO2: 27 mEq/L (ref 19–32)
Calcium: 9.5 mg/dL (ref 8.4–10.5)
Chloride: 103 mEq/L (ref 96–112)
Creatinine, Ser: 0.65 mg/dL (ref 0.40–1.20)
GFR: 107.95 mL/min (ref >60.00)
Glucose, Bld: 120 mg/dL — ABNORMAL HIGH (ref 70–99)
Potassium: 4.4 mEq/L (ref 3.5–5.1)
Sodium: 136 mEq/L (ref 135–145)
Total Bilirubin: 0.5 mg/dL (ref 0.2–1.2)
Total Protein: 8 g/dL (ref 6.0–8.3)

## 2021-05-07 LAB — LIPID PANEL
Cholesterol: 186 mg/dL (ref 0–200)
HDL: 38 mg/dL — ABNORMAL LOW (ref >39.00)
NonHDL: 148.15
Total CHOL/HDL Ratio: 5
Triglycerides: 234 mg/dL — ABNORMAL HIGH (ref 0.0–149.0)
VLDL: 46.8 mg/dL — ABNORMAL HIGH (ref 0.0–40.0)

## 2021-05-07 LAB — LDL CHOLESTEROL, DIRECT: Direct LDL: 127 mg/dL

## 2021-05-07 LAB — HEMOGLOBIN A1C: Hgb A1c MFr Bld: 6.5 % (ref 4.6–6.5)

## 2021-05-08 ENCOUNTER — Other Ambulatory Visit: Payer: BC Managed Care – PPO

## 2021-05-14 DIAGNOSIS — Z79899 Other long term (current) drug therapy: Secondary | ICD-10-CM | POA: Diagnosis not present

## 2021-05-14 DIAGNOSIS — L649 Androgenic alopecia, unspecified: Secondary | ICD-10-CM | POA: Diagnosis not present

## 2021-05-15 ENCOUNTER — Ambulatory Visit (INDEPENDENT_AMBULATORY_CARE_PROVIDER_SITE_OTHER): Payer: BC Managed Care – PPO | Admitting: Family Medicine

## 2021-05-15 ENCOUNTER — Other Ambulatory Visit: Payer: Self-pay

## 2021-05-15 ENCOUNTER — Encounter: Payer: Self-pay | Admitting: Family Medicine

## 2021-05-15 VITALS — BP 110/80 | HR 85 | Temp 97.7°F | Ht 64.0 in | Wt 200.1 lb

## 2021-05-15 DIAGNOSIS — R3129 Other microscopic hematuria: Secondary | ICD-10-CM

## 2021-05-15 DIAGNOSIS — E669 Obesity, unspecified: Secondary | ICD-10-CM

## 2021-05-15 DIAGNOSIS — E785 Hyperlipidemia, unspecified: Secondary | ICD-10-CM

## 2021-05-15 DIAGNOSIS — E282 Polycystic ovarian syndrome: Secondary | ICD-10-CM

## 2021-05-15 DIAGNOSIS — L709 Acne, unspecified: Secondary | ICD-10-CM

## 2021-05-15 DIAGNOSIS — N926 Irregular menstruation, unspecified: Secondary | ICD-10-CM

## 2021-05-15 DIAGNOSIS — E118 Type 2 diabetes mellitus with unspecified complications: Secondary | ICD-10-CM

## 2021-05-15 DIAGNOSIS — Z0000 Encounter for general adult medical examination without abnormal findings: Secondary | ICD-10-CM

## 2021-05-15 DIAGNOSIS — I10 Essential (primary) hypertension: Secondary | ICD-10-CM

## 2021-05-15 MED ORDER — LOSARTAN POTASSIUM 100 MG PO TABS: 100.0000 mg | ORAL_TABLET | Freq: Every day | ORAL | 3 refills | Status: DC

## 2021-05-15 MED ORDER — AMLODIPINE BESYLATE 5 MG PO TABS: 5.0000 mg | ORAL_TABLET | Freq: Every day | ORAL | 3 refills | Status: DC

## 2021-05-15 NOTE — Assessment & Plan Note (Signed)
Preventative protocols reviewed and updated unless pt declined. Discussed healthy diet and lifestyle.  

## 2021-05-15 NOTE — Assessment & Plan Note (Signed)
Chronic, significant improvement since spironolactone was started - continue current regimen.

## 2021-05-15 NOTE — Patient Instructions (Addendum)
Schedule well woman exam as well as mammogram.  Blood pressures are doing wonderful! Continue current medicines.  You are doing well today Good to see you Return as needed or in 6 months for diabetes recheck.   Health Maintenance, Female Adopting a healthy lifestyle and getting preventive care are important in promoting health and wellness. Ask your health care provider about: The right schedule for you to have regular tests and exams. Things you can do on your own to prevent diseases and keep yourself healthy. What should I know about diet, weight, and exercise? Eat a healthy diet  Eat a diet that includes plenty of vegetables, fruits, low-fat dairy products, and lean protein. Do not eat a lot of foods that are high in solid fats, added sugars, or sodium. Maintain a healthy weight Body mass index (BMI) is used to identify weight problems. It estimates body fat based on height and weight. Your health care provider can help determine your BMI and help you achieve or maintain a healthy weight. Get regular exercise Get regular exercise. This is one of the most important things you can do for your health. Most adults should: Exercise for at least 150 minutes each week. The exercise should increase your heart rate and make you sweat (moderate-intensity exercise). Do strengthening exercises at least twice a week. This is in addition to the moderate-intensity exercise. Spend less time sitting. Even light physical activity can be beneficial. Watch cholesterol and blood lipids Have your blood tested for lipids and cholesterol at 43 years of age, then have this test every 5 years. Have your cholesterol levels checked more often if: Your lipid or cholesterol levels are high. You are older than 43 years of age. You are at high risk for heart disease. What should I know about cancer screening? Depending on your health history and family history, you may need to have cancer screening at various ages.  This may include screening for: Breast cancer. Cervical cancer. Colorectal cancer. Skin cancer. Lung cancer. What should I know about heart disease, diabetes, and high blood pressure? Blood pressure and heart disease High blood pressure causes heart disease and increases the risk of stroke. This is more likely to develop in people who have high blood pressure readings, are of African descent, or are overweight. Have your blood pressure checked: Every 3-5 years if you are 60-90 years of age. Every year if you are 40 years old or older. Diabetes Have regular diabetes screenings. This checks your fasting blood sugar level. Have the screening done: Once every three years after age 30 if you are at a normal weight and have a low risk for diabetes. More often and at a younger age if you are overweight or have a high risk for diabetes. What should I know about preventing infection? Hepatitis B If you have a higher risk for hepatitis B, you should be screened for this virus. Talk with your health care provider to find out if you are at risk for hepatitis B infection. Hepatitis C Testing is recommended for: Everyone born from [birth year] through 1965. Anyone with known risk factors for hepatitis C. Sexually transmitted infections (STIs) Get screened for STIs, including gonorrhea and chlamydia, if: You are sexually active and are younger than 43 years of age. You are older than 43 years of age and your health care provider tells you that you are at risk for this type of infection. Your sexual activity has changed since you were last screened, and you are at  increased risk for chlamydia or gonorrhea. Ask your health care provider if you are at risk. Ask your health care provider about whether you are at high risk for HIV. Your health care provider may recommend a prescription medicine to help prevent HIV infection. If you choose to take medicine to prevent HIV, you should first get tested for HIV. You  should then be tested every 3 months for as long as you are taking the medicine. Pregnancy If you are about to stop having your period (premenopausal) and you may become pregnant, seek counseling before you get pregnant. Take 400 to 800 micrograms (mcg) of folic acid every day if you become pregnant. Ask for birth control (contraception) if you want to prevent pregnancy. Osteoporosis and menopause Osteoporosis is a disease in which the bones lose minerals and strength with aging. This can result in bone fractures. If you are 8 years old or older, or if you are at risk for osteoporosis and fractures, ask your health care provider if you should: Be screened for bone loss. Take a calcium or vitamin D supplement to lower your risk of fractures. Be given hormone replacement therapy (HRT) to treat symptoms of menopause. Follow these instructions at home: Lifestyle Do not use any products that contain nicotine or tobacco, such as cigarettes, e-cigarettes, and chewing tobacco. If you need help quitting, ask your health care provider. Do not use street drugs. Do not share needles. Ask your health care provider for help if you need support or information about quitting drugs. Alcohol use Do not drink alcohol if: Your health care provider tells you not to drink. You are pregnant, may be pregnant, or are planning to become pregnant. If you drink alcohol: Limit how much you use to 0-1 drink a day. Limit intake if you are breastfeeding. Be aware of how much alcohol is in your drink. In the [place] one drink equals one 12 oz bottle of beer (355 mL), one 5 oz glass of wine (148 mL), or one 1 oz glass of hard liquor (44 mL). General instructions Schedule regular health, dental, and eye exams. Stay current with your vaccines. Tell your health care provider if: You often feel depressed. You have ever been abused or do not feel safe at home. Summary Adopting a healthy lifestyle and getting preventive care  are important in promoting health and wellness. Follow your health care provider's instructions about healthy diet, exercising, and getting tested or screened for diseases. Follow your health care provider's instructions on monitoring your cholesterol and blood pressure. This information is not intended to replace advice given to you by your health care provider. Make sure you discuss any questions you have with your health care provider. Document Revised: 10/24/2020 Document Reviewed: 08/09/2018 Elsevier Patient Education  2022 [employer].

## 2021-05-15 NOTE — Assessment & Plan Note (Signed)
Encouraged healthy diet and lifestyle choices to affect sustainable weight loss.  ?

## 2021-05-15 NOTE — Assessment & Plan Note (Signed)
New diagnosis. Reviewed diet choices to improve levels.  I asked her to return in 6 months for DM f/u visit.

## 2021-05-15 NOTE — Assessment & Plan Note (Signed)
S/p BTL. No period in 6 yrs.

## 2021-05-15 NOTE — Assessment & Plan Note (Signed)
She was unable to provide urine sample today - consider rechecking.

## 2021-05-15 NOTE — Progress Notes (Signed)
Patient ID: Rachel Bailey, female    DOB: 12-11-1975, 45 y.o.   MRN: [id number]  This visit was conducted in person.  BP 110/80   Pulse 85   Temp 97.7 F (36.5 C) (Temporal)   Ht 5\' 4"  (1.626 m)   Wt 200 lb 1 oz (90.7 kg)   SpO2 98%   BMI 34.34 kg/m    CC: CPE Subjective:   HPI: Rachel Bailey is a 45 y.o. female presenting on 05/15/2021 for Annual Exam   PCOS - previously used metformin XR for fertility.  HTN - currently on amlodipine 5mg  daily as well as losartan 100mg  daily and spironolactone 100mg  daily. Home BP readings well controlled. Spironolactone through dermatology.    Preventative: Well woman exam - OBGYN in [facility] (last seen 2019), all normal pap smears in the past.  LMP - 6 years ago  Breast cancer screening - mammo done 2019. Planning to repeat this year  DEXA scan - not due  Flu shot - declines  COVID vaccine - Pfizer 11/2019, 12/2019, booster 07/2020  Tdap 2015 Pneumonia shot - not due Shingrix - not due  Advanced directive discussion -  Seat belt use discussed  Sunscreen use discussed. No changing moles on skin. Sleep - averages 7-8 hours of sleep/night Non smoker  Alcohol - occasional glass of wine  Dentist - q6 mo  Eye exam - yearly   Caffeine: 2 cups coffee/day  Lives with husband, son (2005), 2 twins (2012), outdoor 08/2020  Occupation: 2016 at 07-10-1977  Edu: BS in business administration  Activity: walking 2x/wk on treadmill for 20 min  Diet: good water, fruits/vegetables daily      Relevant past medical, surgical, family and social history reviewed and updated as indicated. Interim medical history since our last visit reviewed. Allergies and medications reviewed and updated. Outpatient Medications Prior to Visit  Medication Sig Dispense Refill   amLODipine (NORVASC) 5 MG tablet TAKE 1 TABLET BY MOUTH EVERY DAY 90 tablet 2   losartan (COZAAR) 100 MG tablet Take 1 tablet (100 mg total) by mouth daily. 90 tablet 3    spironolactone (ALDACTONE) 100 MG tablet SMARTSIG:1 Tablet(s) By Mouth Every Evening     valsartan (DIOVAN) 80 MG tablet Take 1 tablet (80 mg total) by mouth daily. 90 tablet 0   spironolactone (ALDACTONE) 100 MG tablet Take 1 tablet (100 mg total) by mouth daily. Through dermatology     No facility-administered medications prior to visit.     Per HPI unless specifically indicated in ROS section below Review of Systems  Constitutional:  Negative for activity change, appetite change, chills, fatigue, fever and unexpected weight change.  HENT:  Negative for hearing loss.   Eyes:  Negative for visual disturbance.  Respiratory:  Negative for cough, chest tightness, shortness of breath and wheezing.   Cardiovascular:  Negative for chest pain, palpitations and leg swelling.  Gastrointestinal:  Negative for abdominal distention, abdominal pain, blood in stool, constipation, diarrhea, nausea and vomiting.  Genitourinary:  Negative for difficulty urinating and hematuria.  Musculoskeletal:  Negative for arthralgias, myalgias and neck pain.  Skin:  Negative for rash.  Neurological:  Negative for dizziness, seizures, syncope and headaches.  Hematological:  Negative for adenopathy. Does not bruise/bleed easily.  Psychiatric/Behavioral:  Negative for dysphoric mood. The patient is not nervous/anxious.    Objective:  BP 110/80   Pulse 85   Temp 97.7 F (36.5 C) (Temporal)   Ht 5\' 4"  (1.626 m)  Wt 200 lb 1 oz (90.7 kg)   SpO2 98%   BMI 34.34 kg/m   Wt Readings from Last 3 Encounters:  05/15/21 200 lb 1 oz (90.7 kg)  11/07/20 200 lb 2 oz (90.8 kg)  09/12/20 201 lb (91.2 kg)      Physical Exam Vitals and nursing note reviewed.  Constitutional:      Appearance: Normal appearance. She is not ill-appearing.  HENT:     Head: Normocephalic and atraumatic.     Right Ear: Tympanic membrane, ear canal and external ear normal. There is no impacted cerumen.     Left Ear: Tympanic membrane, ear  canal and external ear normal. There is no impacted cerumen.  Eyes:     General:        Right eye: No discharge.        Left eye: No discharge.     Extraocular Movements: Extraocular movements intact.     Conjunctiva/sclera: Conjunctivae normal.     Pupils: Pupils are equal, round, and reactive to light.  Neck:     Thyroid: No thyroid mass or thyromegaly.  Cardiovascular:     Rate and Rhythm: Normal rate and regular rhythm.     Pulses: Normal pulses.     Heart sounds: Normal heart sounds. No murmur heard. Pulmonary:     Effort: Pulmonary effort is normal. No respiratory distress.     Breath sounds: Normal breath sounds. No wheezing, rhonchi or rales.  Abdominal:     General: Bowel sounds are normal. There is no distension.     Palpations: Abdomen is soft. There is no mass.     Tenderness: There is no abdominal tenderness. There is no guarding or rebound.     Hernia: No hernia is present.  Musculoskeletal:     Cervical back: Normal range of motion and neck supple. No rigidity.     Right lower leg: No edema.     Left lower leg: No edema.  Lymphadenopathy:     Cervical: No cervical adenopathy.  Skin:    General: Skin is warm and dry.     Findings: No rash.  Neurological:     General: No focal deficit present.     Mental Status: She is alert. Mental status is at baseline.  Psychiatric:        Mood and Affect: Mood normal.        Behavior: Behavior normal.      Results for orders placed or performed in visit on 05/07/21  Hemoglobin A1c  Result Value Ref Range   Hgb A1c MFr Bld 6.5 4.6 - 6.5 %  Comprehensive metabolic panel  Result Value Ref Range   Sodium 136 135 - 145 mEq/L   Potassium 4.4 3.5 - 5.1 mEq/L   Chloride 103 96 - 112 mEq/L   CO2 27 19 - 32 mEq/L   Glucose, Bld 120 (H) 70 - 99 mg/dL   BUN 14 6 - 23 mg/dL   Creatinine, Ser 6.28 0.40 - 1.20 mg/dL   Total Bilirubin 0.5 0.2 - 1.2 mg/dL   Alkaline Phosphatase 47 39 - 117 U/L   AST 16 0 - 37 U/L   ALT 26 0 - 35  U/L   Total Protein 8.0 6.0 - 8.3 g/dL   Albumin 4.4 3.5 - 5.2 g/dL   GFR 315.17 >61.60 mL/min   Calcium 9.5 8.4 - 10.5 mg/dL  Lipid panel  Result Value Ref Range   Cholesterol 186 0 - 200 mg/dL  Triglycerides 234.0 (H) 0.0 - 149.0 mg/dL   HDL 16.10 (L) >96.04 mg/dL   VLDL 54.0 (H) 0.0 - 98.1 mg/dL   Total CHOL/HDL Ratio 5    NonHDL 148.15   LDL cholesterol, direct  Result Value Ref Range   Direct LDL 127.0 mg/dL    Assessment & Plan:  This visit occurred during the SARS-CoV-2 public health emergency.  Safety protocols were in place, including screening questions prior to the visit, additional usage of staff PPE, and extensive cleaning of exam room while observing appropriate contact time as indicated for disinfecting solutions.   Problem List Items Addressed This Visit     Irregular periods    No LMP in years. Anticipate due to PCOS.  Needs to schedule f/u with OBGYN.       Acne    On spironolactone through derm.       Healthcare maintenance - Primary    Preventative protocols reviewed and updated unless pt declined. Discussed healthy diet and lifestyle.       Obesity, Class I, BMI 30-34.9    Encouraged healthy diet and lifestyle choices to affect sustainable weight loss.       Dyslipidemia    Chronic, improvement in triglycerides noted. Encouraged ongoing diet efforts to control lipids. The 10-year ASCVD risk score (Arnett DK, et al., 2019) is: 1.2%   Values used to calculate the score:     Age: 80 years     Sex: Female     Is Non-Hispanic African American: No     Diabetic: No     Tobacco smoker: No     Systolic Blood Pressure: 110 mmHg     Is BP treated: Yes     HDL Cholesterol: 38 mg/dL     Total Cholesterol: 186 mg/dL       PCOS (polycystic ovarian syndrome)    S/p BTL. No period in 6 yrs.       Hypertension    Chronic, significant improvement since spironolactone was started - continue current regimen.       Relevant Medications   spironolactone  (ALDACTONE) 100 MG tablet   amLODipine (NORVASC) 5 MG tablet   losartan (COZAAR) 100 MG tablet   Controlled diabetes mellitus type 2 with complications (HCC)    New diagnosis. Reviewed diet choices to improve levels.  I asked her to return in 6 months for DM f/u visit.       Relevant Medications   losartan (COZAAR) 100 MG tablet   Microhematuria    She was unable to provide urine sample today - consider rechecking.         Meds ordered this encounter  Medications   amLODipine (NORVASC) 5 MG tablet    Sig: Take 1 tablet (5 mg total) by mouth daily.    Dispense:  90 tablet    Refill:  3   losartan (COZAAR) 100 MG tablet    Sig: Take 1 tablet (100 mg total) by mouth daily.    Dispense:  90 tablet    Refill:  3   No orders of the defined types were placed in this encounter.   Patient instructions: Schedule well woman exam as well as mammogram.  Blood pressures are doing wonderful! Continue current medicines.  You are doing well today Good to see you Return as needed or in 6 months for diabetes recheck.   Follow up plan: Return in about 6 months (around 11/12/2021) for follow up visit.  Eustaquio Boyden, MD

## 2021-05-15 NOTE — Assessment & Plan Note (Signed)
Chronic, improvement in triglycerides noted. Encouraged ongoing diet efforts to control lipids. The 10-year ASCVD risk score (Arnett DK, et al., 2019) is: 1.2%   Values used to calculate the score:     Age: 45 years     Sex: Female     Is Non-Hispanic African American: No     Diabetic: No     Tobacco smoker: No     Systolic Blood Pressure: 110 mmHg     Is BP treated: Yes     HDL Cholesterol: 38 mg/dL     Total Cholesterol: 186 mg/dL

## 2021-05-15 NOTE — Assessment & Plan Note (Signed)
On spironolactone through derm.

## 2021-05-15 NOTE — Assessment & Plan Note (Signed)
No LMP in years. Anticipate due to PCOS.  Needs to schedule f/u with OBGYN.

## 2021-06-14 ENCOUNTER — Encounter: Payer: Self-pay | Admitting: Primary Care

## 2021-06-14 NOTE — Telephone Encounter (Signed)
Hey, FYI. Looks like she was diagnosed one month ago and unaware that she has diabetes?   I do still recommend screening if she agrees.

## 2021-07-04 ENCOUNTER — Encounter: Payer: Self-pay | Admitting: Family Medicine

## 2021-07-05 MED ORDER — TIRZEPATIDE 2.5 MG/0.5ML ~~LOC~~ SOAJ: 2.5000 mg | SUBCUTANEOUS | 0 refills | Status: DC

## 2021-07-09 ENCOUNTER — Telehealth: Payer: Self-pay

## 2021-07-09 NOTE — Telephone Encounter (Signed)
Received faxed PA request from [facility] for Mounjaro 2.5 mg/0.5 ml pen.  Submitted PA; key: [id number].  Decision pending.

## 2021-07-22 MED ORDER — TIRZEPATIDE 5 MG/0.5ML ~~LOC~~ SOAJ: 5.0000 mg | SUBCUTANEOUS | 0 refills | Status: DC

## 2021-07-22 NOTE — Addendum Note (Signed)
Addended by: Eustaquio Boyden on: 07/22/2021 04:30 PM   Modules accepted: Orders

## 2021-07-27 DIAGNOSIS — Z1231 Encounter for screening mammogram for malignant neoplasm of breast: Secondary | ICD-10-CM | POA: Diagnosis not present

## 2021-07-27 LAB — HM MAMMOGRAPHY

## 2021-07-30 ENCOUNTER — Encounter: Payer: Self-pay | Admitting: Family Medicine

## 2021-08-10 DIAGNOSIS — Z79899 Other long term (current) drug therapy: Secondary | ICD-10-CM | POA: Diagnosis not present

## 2021-08-10 DIAGNOSIS — L649 Androgenic alopecia, unspecified: Secondary | ICD-10-CM | POA: Diagnosis not present

## 2021-08-17 ENCOUNTER — Other Ambulatory Visit: Payer: Self-pay | Admitting: Family Medicine

## 2021-08-17 MED ORDER — TIRZEPATIDE 7.5 MG/0.5ML ~~LOC~~ SOAJ: 7.5000 mg | SUBCUTANEOUS | 1 refills | Status: DC

## 2021-08-17 NOTE — Addendum Note (Signed)
Addended by: Eustaquio Boyden on: 08/17/2021 05:17 PM   Modules accepted: Orders

## 2021-08-18 NOTE — Telephone Encounter (Signed)
Spoke with [facility] asking about Mounjaro.  States the 7.5 mg is on backorder.  2.5 mg and 5 mg is available.

## 2021-08-22 MED ORDER — TIRZEPATIDE 5 MG/0.5ML ~~LOC~~ SOAJ: 5.0000 mg | SUBCUTANEOUS | 0 refills | Status: DC

## 2021-08-22 MED ORDER — TIRZEPATIDE 2.5 MG/0.5ML ~~LOC~~ SOAJ: 2.5000 mg | SUBCUTANEOUS | 0 refills | Status: DC

## 2021-08-22 NOTE — Telephone Encounter (Signed)
2.5 + 5mg  dose sent to pharmacy

## 2021-09-16 MED ORDER — TIRZEPATIDE 10 MG/0.5ML ~~LOC~~ SOAJ: 10.0000 mg | SUBCUTANEOUS | 0 refills | Status: DC

## 2021-09-16 NOTE — Addendum Note (Signed)
Addended by: Eustaquio Boyden on: 09/16/2021 08:13 AM   Modules accepted: Orders

## 2021-09-22 ENCOUNTER — Telehealth: Payer: Self-pay | Admitting: Family Medicine

## 2021-09-22 NOTE — Telephone Encounter (Signed)
Pt called stating that medication tirzepatide [facility]) 10 MG/0.5ML Pen is on back order. Pt is asking is there a substitute or do you know what other Pharmacy may have it. Please advise.

## 2021-09-23 NOTE — Telephone Encounter (Signed)
I'm sorry mounjaro is now on back order too. If pt interested we could transition to trulicity for pt and husband while we await resupply of mounjaro

## 2021-09-23 NOTE — Telephone Encounter (Signed)
Spoke with pt relaying Dr. Timoteo Expose message.  Pt states they are not interested in transitioning to Trulicity due to it not working previously for her husband.  Pt is asking if there is anything else they can take.  Plz advise.

## 2021-09-25 MED ORDER — OZEMPIC (0.25 OR 0.5 MG/DOSE) 2 MG/1.5ML ~~LOC~~ SOPN: PEN_INJECTOR | SUBCUTANEOUS | 6 refills | Status: DC

## 2021-09-25 NOTE — Telephone Encounter (Signed)
Spoke with pt relaying Dr. G's message. Pt verbalizes understanding.  

## 2021-09-25 NOTE — Telephone Encounter (Signed)
Plz notify I've sent in Rachel Bailey for her and her husband.

## 2021-09-25 NOTE — Addendum Note (Signed)
Addended by: Eustaquio Boyden on: 09/25/2021 07:27 AM   Modules accepted: Orders

## 2021-09-29 MED ORDER — OZEMPIC (0.25 OR 0.5 MG/DOSE) 2 MG/1.5ML ~~LOC~~ SOPN: PEN_INJECTOR | SUBCUTANEOUS | 6 refills | Status: DC

## 2021-09-29 MED ORDER — TIRZEPATIDE 5 MG/0.5ML ~~LOC~~ SOAJ: 10.0000 mg | SUBCUTANEOUS | 0 refills | Status: DC

## 2021-09-29 MED ORDER — TIRZEPATIDE 7.5 MG/0.5ML ~~LOC~~ SOAJ: 7.5000 mg | SUBCUTANEOUS | 0 refills | Status: DC

## 2021-09-29 MED ORDER — TIRZEPATIDE 10 MG/0.5ML ~~LOC~~ SOAJ: 10.0000 mg | SUBCUTANEOUS | 0 refills | Status: DC

## 2021-09-29 NOTE — Telephone Encounter (Addendum)
Spoke with patient. Don't recommend jumping to 12.5mg  dose (previously on 7.5mg ). Will instead drop dose to 7.5mg  weekly for another month.  Has lost 13 lbs on mounjaro!

## 2021-09-29 NOTE — Telephone Encounter (Signed)
Pt called in stated [name]  does not have RX tirzepatide [name] 10 MG/0.5ML Pen .  But do have it in 12.5 mg . Pt wants to know can RX be resend in for the 12.5 mg . Please Advise [phone]

## 2021-09-29 NOTE — Addendum Note (Signed)
Addended by: Nanci Pina on: 09/29/2021 10:08 AM   Modules accepted: Orders

## 2021-09-29 NOTE — Telephone Encounter (Signed)
Pharmacy only has 5mg  and 12.5mg  dose. Will send in 2 5mg  for total 10mg  weekly.

## 2021-09-29 NOTE — Addendum Note (Signed)
Addended by: Eustaquio Boyden on: 09/29/2021 05:11 PM   Modules accepted: Orders

## 2021-09-29 NOTE — Addendum Note (Signed)
Addended by: Emorie Mcfate on: 09/29/2021 02:19 PM ° ° Modules accepted: Orders ° °

## 2021-09-29 NOTE — Addendum Note (Signed)
Addended by: Eustaquio Boyden on: 09/29/2021 05:21 PM   Modules accepted: Orders

## 2021-09-29 NOTE — Telephone Encounter (Signed)
E-scribed rx to [facility] [place] as requested.

## 2021-09-29 NOTE — Telephone Encounter (Signed)
Pt called stating that [facility] [address] [place] has medication tirzepatide [facility]) 10 MG/0.5ML Pen. Pt is asking you to send the prescription there.

## 2021-10-01 MED ORDER — TIRZEPATIDE 7.5 MG/0.5ML ~~LOC~~ SOAJ: 7.5000 mg | SUBCUTANEOUS | 1 refills | Status: DC

## 2021-10-01 NOTE — Telephone Encounter (Signed)
Plz notify i've sent in mounjaro 7.5mg  to [facility]

## 2021-10-01 NOTE — Telephone Encounter (Signed)
See 07/04/21, Pt Msg.

## 2021-10-01 NOTE — Addendum Note (Signed)
Addended by: Eustaquio Boyden on: 10/01/2021 10:52 AM   Modules accepted: Orders

## 2021-10-01 NOTE — Telephone Encounter (Signed)
Notified pt via My Chart.

## 2021-10-01 NOTE — Telephone Encounter (Signed)
Ms. Marciela called in and stated that the pharmacy stated that the insurance would not both pens only 1 and wanted to change it to a pharmacy in [place] and they have the 7.5 mg they are just waiting for the script to fill it, they have been holding it for her for the past 2 days.  The pharmacy is [facility] [address] [place], [place], [place] [address] ph: [phone]

## 2021-10-21 MED ORDER — MOUNJARO 10 MG/0.5ML ~~LOC~~ SOAJ: 10.0000 mg | SUBCUTANEOUS | 0 refills | Status: DC

## 2021-10-21 NOTE — Addendum Note (Signed)
Addended by: Eustaquio Boyden on: 10/21/2021 07:20 AM   Modules accepted: Orders

## 2021-11-13 ENCOUNTER — Encounter: Payer: Self-pay | Admitting: Family Medicine

## 2021-11-13 ENCOUNTER — Other Ambulatory Visit: Payer: Self-pay

## 2021-11-13 ENCOUNTER — Ambulatory Visit (INDEPENDENT_AMBULATORY_CARE_PROVIDER_SITE_OTHER): Payer: BC Managed Care – PPO | Admitting: Family Medicine

## 2021-11-13 VITALS — BP 120/84 | HR 79 | Temp 97.4°F | Ht 64.0 in | Wt 180.4 lb

## 2021-11-13 DIAGNOSIS — E669 Obesity, unspecified: Secondary | ICD-10-CM

## 2021-11-13 DIAGNOSIS — I10 Essential (primary) hypertension: Secondary | ICD-10-CM

## 2021-11-13 DIAGNOSIS — R8290 Unspecified abnormal findings in urine: Secondary | ICD-10-CM

## 2021-11-13 DIAGNOSIS — R3129 Other microscopic hematuria: Secondary | ICD-10-CM

## 2021-11-13 DIAGNOSIS — N926 Irregular menstruation, unspecified: Secondary | ICD-10-CM | POA: Diagnosis not present

## 2021-11-13 DIAGNOSIS — E282 Polycystic ovarian syndrome: Secondary | ICD-10-CM

## 2021-11-13 DIAGNOSIS — E118 Type 2 diabetes mellitus with unspecified complications: Secondary | ICD-10-CM | POA: Diagnosis not present

## 2021-11-13 DIAGNOSIS — N390 Urinary tract infection, site not specified: Secondary | ICD-10-CM | POA: Insufficient documentation

## 2021-11-13 LAB — POC URINALSYSI DIPSTICK (AUTOMATED)
Bilirubin, UA: NEGATIVE
Glucose, UA: NEGATIVE
Ketones, UA: NEGATIVE
Nitrite, UA: NEGATIVE
Protein, UA: POSITIVE — AB
Spec Grav, UA: 1.015 (ref 1.010–1.025)
Urobilinogen, UA: 0.2 E.U./dL
pH, UA: 6 (ref 5.0–8.0)

## 2021-11-13 LAB — POCT GLYCOSYLATED HEMOGLOBIN (HGB A1C): Hemoglobin A1C: 5.2 % (ref 4.0–5.6)

## 2021-11-13 MED ORDER — MOUNJARO 10 MG/0.5ML ~~LOC~~ SOAJ: 10.0000 mg | SUBCUTANEOUS | 1 refills | Status: DC

## 2021-11-13 NOTE — Assessment & Plan Note (Addendum)
No significant hematuria on check today. ?

## 2021-11-13 NOTE — Assessment & Plan Note (Signed)
First period in 5 yrs after 20 lb weight loss (09/2021) ?

## 2021-11-13 NOTE — Assessment & Plan Note (Signed)
Congratulated on 20 lb weight loss! Continue healthy diet and lifestyle and mounjaro.   ?

## 2021-11-13 NOTE — Progress Notes (Signed)
? ? Patient ID: Rachel Bailey, female    DOB: 1976-07-08, 46 y.o.   MRN: [id number] ? ?This visit was conducted in person. ? ?BP 120/84   Pulse 79   Temp (!) 97.4 ?F (36.3 ?C) (Temporal)   Ht 5\' 4"  (1.626 m)   Wt 180 lb 6 oz (81.8 kg)   LMP 10/27/2021   SpO2 99%   BMI 30.96 kg/m?   ? ?CC: DM f/u visit  ?Subjective:  ? ?HPI: ?Rachel Bailey is a 46 y.o. female presenting on 11/13/2021 for Diabetes (Here for 6 mo f/u.  Also, c/o foul urine odor since last wk.) ? ? ?Just left stressful job at [employer], planning to return to previous job. Happy about this.  ? ?Mounjaro started late last year - tolerating well, with resultant 20 lb weight loss! Denies nausea, diarrhea, constipation, or epigastric pain. She's also been using [employer] meal planning system.  ? ?First period in 5 yrs 10/27/2021. S/p BTL.  ? ?1 wk h/o foul smelling urine - no abd pain, flank pain, nausea/vomiting, fevers, dysuria, urgency or frequency or hematuria.  ? ?HTN - BP well controlled on current regimen of amlodipine 5mg , losartan 100mg  daily, spironolactone 100mg  (through dermatology).  ? ?DM - diagnosed 04/2021. Does not regularly check sugars. Compliant with antihyperglycemic regimen which includes: mounjaro 10mg  weekly. Denies low sugars or hypoglycemic symptoms. Denies paresthesias, blurry vision. Last diabetic eye exam DUE. Glucometer brand: maybe one touch. Last foot exam: DUE. DSME: declines.  ?Lab Results  ?Component Value Date  ? HGBA1C 5.2 11/13/2021  ? ?Diabetic Foot Exam - Simple   ?Simple Foot Form ?Diabetic Foot exam was performed with the following findings: Yes 11/13/2021  9:09 AM  ?Visual Inspection ?No deformities, no ulcerations, no other skin breakdown bilaterally: Yes ?Sensation Testing ?Intact to touch and monofilament testing bilaterally: Yes ?Pulse Check ?Posterior Tibialis and Dorsalis pulse intact bilaterally: Yes ?Comments ?  ? ?No results found for: Rachel Bailey  ? ?   ? ?Relevant past medical, surgical, family and social  history reviewed and updated as indicated. Interim medical history since our last visit reviewed. ?Allergies and medications reviewed and updated. ?Outpatient Medications Prior to Visit  ?Medication Sig Dispense Refill  ? amLODipine (NORVASC) 5 MG tablet Take 1 tablet (5 mg total) by mouth daily. 90 tablet 3  ? losartan (COZAAR) 100 MG tablet Take 1 tablet (100 mg total) by mouth daily. 90 tablet 3  ? spironolactone (ALDACTONE) 100 MG tablet Take 1 tablet (100 mg total) by mouth daily. Through dermatology    ? tirzepatide Rachel [name] 10 MG/0.5ML Pen Inject 10 mg into the skin once a week. 2 mL 0  ? ?No facility-administered medications prior to visit.  ?  ? ?Per HPI unless specifically indicated in ROS section below ?Review of Systems ? ?Objective:  ?BP 120/84   Pulse 79   Temp (!) 97.4 ?F (36.3 ?C) (Temporal)   Ht 5\' 4"  (1.626 m)   Wt 180 lb 6 oz (81.8 kg)   LMP 10/27/2021   SpO2 99%   BMI 30.96 kg/m?   ?Wt Readings from Last 3 Encounters:  ?11/13/21 180 lb 6 oz (81.8 kg)  ?05/15/21 200 lb 1 oz (90.7 kg)  ?11/07/20 200 lb 2 oz (90.8 kg)  ?  ?  ?Physical Exam ?Vitals and nursing note reviewed.  ?Constitutional:   ?   Appearance: Normal appearance. She is not ill-appearing.  ?Eyes:  ?   Extraocular Movements: Extraocular movements intact.  ?  Conjunctiva/sclera: Conjunctivae normal.  ?   Pupils: Pupils are equal, round, and reactive to light.  ?Cardiovascular:  ?   Rate and Rhythm: Normal rate and regular rhythm.  ?   Pulses: Normal pulses.  ?   Heart sounds: Normal heart sounds. No murmur heard. ?Pulmonary:  ?   Effort: Pulmonary effort is normal. No respiratory distress.  ?   Breath sounds: Normal breath sounds. No wheezing, rhonchi or rales.  ?Musculoskeletal:  ?   Right lower leg: No edema.  ?   Left lower leg: No edema.  ?Skin: ?   General: Skin is warm and dry.  ?   Findings: No rash.  ?Neurological:  ?   Mental Status: She is alert.  ?Psychiatric:     ?   Mood and Affect: Mood normal.     ?   Behavior:  Behavior normal.  ? ?   ?Results for orders placed or performed in visit on 11/13/21  ?POCT glycosylated hemoglobin (Hb A1C)  ?Result Value Ref Range  ? Hemoglobin A1C 5.2 4.0 - 5.6 %  ? HbA1c POC (<> result, manual entry)    ? HbA1c, POC (prediabetic range)    ? HbA1c, POC (controlled diabetic range)    ?POCT Urinalysis Dipstick (Automated)  ?Result Value Ref Range  ? Color, UA yellow   ? Clarity, UA clear   ? Glucose, UA Negative Negative  ? Bilirubin, UA negative   ? Ketones, UA negative   ? Spec Grav, UA 1.015 1.010 - 1.025  ? Blood, UA 1+   ? pH, UA 6.0 5.0 - 8.0  ? Protein, UA Positive (A) Negative  ? Urobilinogen, UA 0.2 0.2 or 1.0 E.U./dL  ? Nitrite, UA negative   ? Leukocytes, UA Trace (A) Negative  ? ? ?Assessment & Plan:  ?This visit occurred during the SARS-CoV-2 public health emergency.  Safety protocols were in place, including screening questions prior to the visit, additional usage of staff PPE, and extensive cleaning of exam room while observing appropriate contact time as indicated for disinfecting solutions.  ? ?Problem List Items Addressed This Visit   ? ? Irregular periods  ?  With recent weight loss, noted first unassisted normal period last month.  ?S/p BTL ?  ?  ? Obesity, Class I, BMI 30-34.9  ?  Congratulated on 20 lb weight loss! Continue healthy diet and lifestyle and mounjaro.   ?  ?  ? PCOS (polycystic ovarian syndrome)  ?  First period in 5 yrs after 20 lb weight loss (09/2021) ?  ?  ? Hypertension  ?  Chronic, stable on current regimen - continue. ?  ?  ? Controlled diabetes mellitus type 2 with complications ([place]) - Primary  ?  Diagnosed 04/2021.  ?On Mounjaro 10mg  weekly, tolerating well. Will continue this dose.  ?A1c down to 5.2%.  ?Will consider DMSE.  ?  ?  ? Relevant Medications  ? tirzepatide (MOUNJARO) 10 MG/0.5ML Pen  ? Other Relevant Orders  ? POCT glycosylated hemoglobin (Hb A1C) (Completed)  ? Microhematuria  ?  No significant hematuria on check today. ?  ?  ? Abnormal  urine odor  ?  Abnormal smell to urine over the past week without associated UTI symptoms.  ?UA today likely contaminated, UCx sent given bacteria and WBC seen.  ?  ?  ? Relevant Orders  ? POCT Urinalysis Dipstick (Automated) (Completed)  ? Urine Culture  ? ?Other Visit Diagnoses   ? ? Abnormal urinalysis      ?  Relevant Orders  ? Urine Culture  ? ?  ?  ? ?Meds ordered this encounter  ?Medications  ? tirzepatide [facility]) 10 MG/0.5ML Pen  ?  Sig: Inject 10 mg into the skin once a week.  ?  Dispense:  6 mL  ?  Refill:  1  ? ?Orders Placed This Encounter  ?Procedures  ? Urine Culture  ? POCT glycosylated hemoglobin (Hb A1C)  ? POCT Urinalysis Dipstick (Automated)  ? ? ?Patient Instructions  ?Sugar control is doing great! Continue this.  ?Let eye doctor know to do diabetes eye exam.  ?We will send urine culture and will be in touch with results.  ?Return in 6 months for physical.  ? ?Follow up plan: ?Return in about 6 months (around 05/16/2022) for annual exam, prior fasting for blood work. ? ?Ria Bush, MD   ?

## 2021-11-13 NOTE — Assessment & Plan Note (Signed)
With recent weight loss, noted first unassisted normal period last month.  ?S/p BTL ?

## 2021-11-13 NOTE — Assessment & Plan Note (Signed)
Abnormal smell to urine over the past week without associated UTI symptoms.  ?UA today likely contaminated, UCx sent given bacteria and WBC seen.  ?

## 2021-11-13 NOTE — Patient Instructions (Addendum)
Sugar control is doing great! Continue this.  ?Let eye doctor know to do diabetes eye exam.  ?We will send urine culture and will be in touch with results.  ?Return in 6 months for physical.  ?

## 2021-11-13 NOTE — Assessment & Plan Note (Addendum)
Diagnosed 04/2021.  ?On Mounjaro 10mg  weekly, tolerating well. Will continue this dose.  ?A1c down to 5.2%.  ?Will consider DMSE.  ?

## 2021-11-13 NOTE — Assessment & Plan Note (Signed)
Chronic, stable on current regimen - continue. 

## 2021-11-16 ENCOUNTER — Other Ambulatory Visit: Payer: Self-pay | Admitting: Family Medicine

## 2021-11-16 LAB — URINE CULTURE
MICRO NUMBER:: 13145898
SPECIMEN QUALITY:: ADEQUATE

## 2021-11-16 MED ORDER — SULFAMETHOXAZOLE-TRIMETHOPRIM 800-160 MG PO TABS: 1.0000 | ORAL_TABLET | Freq: Two times a day (BID) | ORAL | 0 refills | Status: DC

## 2021-11-17 ENCOUNTER — Encounter: Payer: Self-pay | Admitting: Family Medicine

## 2021-11-17 ENCOUNTER — Ambulatory Visit (INDEPENDENT_AMBULATORY_CARE_PROVIDER_SITE_OTHER): Payer: BC Managed Care – PPO | Admitting: Family Medicine

## 2021-11-17 ENCOUNTER — Other Ambulatory Visit: Payer: Self-pay

## 2021-11-17 VITALS — BP 118/78 | HR 78 | Temp 97.7°F | Wt 181.1 lb

## 2021-11-17 DIAGNOSIS — N3001 Acute cystitis with hematuria: Secondary | ICD-10-CM

## 2021-11-17 DIAGNOSIS — R1084 Generalized abdominal pain: Secondary | ICD-10-CM | POA: Diagnosis not present

## 2021-11-17 DIAGNOSIS — R140 Abdominal distension (gaseous): Secondary | ICD-10-CM | POA: Insufficient documentation

## 2021-11-17 LAB — CBC WITH DIFFERENTIAL/PLATELET
Absolute Monocytes: 403 cells/uL (ref 200–950)
Basophils Absolute: 32 cells/uL (ref 0–200)
Basophils Relative: 0.5 %
Eosinophils Absolute: 19 cells/uL (ref 15–500)
Eosinophils Relative: 0.3 %
HCT: 37.3 % (ref 35.0–45.0)
Hemoglobin: 12.5 g/dL (ref 11.7–15.5)
Lymphs Abs: 2432 cells/uL (ref 850–3900)
MCH: 31.2 pg (ref 27.0–33.0)
MCHC: 33.5 g/dL (ref 32.0–36.0)
MCV: 93 fL (ref 80.0–100.0)
MPV: 9.2 fL (ref 7.5–12.5)
Monocytes Relative: 6.4 %
Neutro Abs: 3415 cells/uL (ref 1500–7800)
Neutrophils Relative %: 54.2 %
Platelets: 250 10*3/uL (ref 140–400)
RBC: 4.01 10*6/uL (ref 3.80–5.10)
RDW: 12.7 % (ref 11.0–15.0)
Total Lymphocyte: 38.6 %
WBC: 6.3 10*3/uL (ref 3.8–10.8)

## 2021-11-17 LAB — COMPREHENSIVE METABOLIC PANEL
AG Ratio: 1.4 (calc) (ref 1.0–2.5)
ALT: 11 U/L (ref 6–29)
AST: 10 U/L (ref 10–30)
Albumin: 4.5 g/dL (ref 3.6–5.1)
Alkaline phosphatase (APISO): 55 U/L (ref 31–125)
BUN/Creatinine Ratio: 13 (calc) (ref 6–22)
BUN: 13 mg/dL (ref 7–25)
CO2: 25 mmol/L (ref 20–32)
Calcium: 9.5 mg/dL (ref 8.6–10.2)
Chloride: 103 mmol/L (ref 98–110)
Creat: 1.04 mg/dL — ABNORMAL HIGH (ref 0.50–0.99)
Globulin: 3.3 g/dL (calc) (ref 1.9–3.7)
Glucose, Bld: 76 mg/dL (ref 65–99)
Potassium: 4.1 mmol/L (ref 3.5–5.3)
Sodium: 136 mmol/L (ref 135–146)
Total Bilirubin: 0.5 mg/dL (ref 0.2–1.2)
Total Protein: 7.8 g/dL (ref 6.1–8.1)

## 2021-11-17 LAB — AMYLASE: Amylase: 37 U/L (ref 21–101)

## 2021-11-17 LAB — LIPASE: Lipase: 22 U/L (ref 7–60)

## 2021-11-17 MED ORDER — CEPHALEXIN 250 MG PO CAPS: 250.0000 mg | ORAL_CAPSULE | Freq: Four times a day (QID) | ORAL | 0 refills | Status: DC

## 2021-11-17 NOTE — Assessment & Plan Note (Addendum)
New, in setting of Mounjaro use (no recent dose changes) and recent UTI initially treated with bactrim, now starting keflex in its place. Symptoms not consistent with pyelonephritis.  ?In [place] use, ?pancreatitis vs biliary/liver disease - check STAT labs today (CBC, CMP, lipase and amylase), rec clear liquid diet for next 24 hours. many of these symptoms can be attributed to Mounjaro (bloating with abdominal distension) - rec hold next Mounjaro dose (Sunday).  ?Update with how she feels over next 24-48 hours.  ?

## 2021-11-17 NOTE — Assessment & Plan Note (Signed)
>  100k E coli UTI sensitive to bactrim, cipro, keflex, nitrofurantoin.  ?Urinary symptoms already improved after 1.5 days of bactrim.  ?Given RUQ and epigatric abd pain, stop bactrim (can be associated with hepatotoxicity) and start in its place keflex 250mg  QID x5d.  ?Update if ongoing UTI symptoms after treatment.  ?

## 2021-11-17 NOTE — Patient Instructions (Addendum)
?  mounjaro side effect.  ?Labs today to check liver and pancreas and for infection.  ?Clear liquid diet for next 24-48 hours. ?Hold mounjaro next dose.  ?Stop bactrim, start keflex antibiotic 4 times daily for 5 days.  ?

## 2021-11-17 NOTE — Progress Notes (Signed)
? ? Patient ID: Rachel Bailey, female    DOB: 04/30/1976, 46 y.o.   MRN: [id number] ? ?This visit was conducted in person. ? ?BP 118/78   Pulse 78   Temp 97.7 ?F (36.5 ?C) (Temporal)   Wt 181 lb 2 oz (82.2 kg)   LMP 10/27/2021   SpO2 99%   BMI 31.09 kg/m?   ? ?CC: bloating, LLQ abd pain, pelvic discomfort, dyspnea ?Subjective:  ? ?HPI: ?Rachel Bailey is a 46 y.o. female presenting on 11/17/2021 for Bloated (C/o abd swelling/bloating, difficulty getting a deep breath, LLQ abd throbbing, pelvic discomfort, urinary urgency and blood in urine. Sxs started after 11/13/21 OV.  Pt accompanied by husband, Rachel Bailey. ) ? ? ?Seen here last week (11/13/2021) for routine diabetes follow up visit - at that time endorsed foul smelling urine without other UTI symptoms. UA abnormal, UCx returned growing >100k E coli and started on bactrim DS 1 tab BID 3d course.  ? ?No other medications were changed at that time.  ?After her appointment, symptoms suddenly worsened - LLQ throbbing discomfort, hematuria, urinary urgency with dribbling, upper abd bloating and swelling associated with dyspnea (trouble taking deep breaths). Mild nausea.  ? ?No vomiting, flank pain, fevers/chills.  ?No bowel changes  - diarrhea, constipation.  ?No new diet changes.  ?Good water intake.  ? ?Urinary symptoms are improving since bactrim started - odor better, no LLQ throbbing, no urgency.  ? ?LMP - 10/27/2021 ?   ? ?Relevant past medical, surgical, family and social history reviewed and updated as indicated. Interim medical history since our last visit reviewed. ?Allergies and medications reviewed and updated. ?Outpatient Medications Prior to Visit  ?Medication Sig Dispense Refill  ? amLODipine (NORVASC) 5 MG tablet Take 1 tablet (5 mg total) by mouth daily. 90 tablet 3  ? losartan (COZAAR) 100 MG tablet Take 1 tablet (100 mg total) by mouth daily. 90 tablet 3  ? spironolactone (ALDACTONE) 100 MG tablet Take 1 tablet (100 mg total) by mouth daily. Through dermatology    ?  tirzepatide Greggory Keen(MOUNJARO) 10 MG/0.5ML Pen Inject 10 mg into the skin once a week. 6 mL 1  ? sulfamethoxazole-trimethoprim (BACTRIM DS) 800-160 MG tablet Take 1 tablet by mouth 2 (two) times daily. 6 tablet 0  ? ?No facility-administered medications prior to visit.  ?  ? ?Per HPI unless specifically indicated in ROS section below ?Review of Systems ? ?Objective:  ?BP 118/78   Pulse 78   Temp 97.7 ?F (36.5 ?C) (Temporal)   Wt 181 lb 2 oz (82.2 kg)   LMP 10/27/2021   SpO2 99%   BMI 31.09 kg/m?   ?Wt Readings from Last 3 Encounters:  ?11/17/21 181 lb 2 oz (82.2 kg)  ?11/13/21 180 lb 6 oz (81.8 kg)  ?05/15/21 200 lb 1 oz (90.7 kg)  ?  ?  ?Physical Exam ?Vitals and nursing note reviewed.  ?Constitutional:   ?   Appearance: Normal appearance. She is not ill-appearing.  ?Eyes:  ?   General: No scleral icterus. ?   Extraocular Movements: Extraocular movements intact.  ?   Conjunctiva/sclera: Conjunctivae normal.  ?   Pupils: Pupils are equal, round, and reactive to light.  ?Cardiovascular:  ?   Rate and Rhythm: Normal rate and regular rhythm.  ?   Pulses: Normal pulses.  ?   Heart sounds: Normal heart sounds. No murmur heard. ?Pulmonary:  ?   Effort: Pulmonary effort is normal. No respiratory distress.  ?   Breath sounds: Normal  breath sounds. No wheezing, rhonchi or rales.  ?Abdominal:  ?   General: There is no distension.  ?   Palpations: Abdomen is soft. There is no mass.  ?   Tenderness: There is abdominal tenderness (mild) in the right upper quadrant, epigastric area and periumbilical area. There is no right CVA tenderness, left CVA tenderness, guarding or rebound. Negative signs include Murphy's sign.  ?   Hernia: No hernia is present.  ?Musculoskeletal:  ?   Right lower leg: No edema.  ?   Left lower leg: No edema.  ?Skin: ?   General: Skin is warm and dry.  ?   Coloration: Skin is not jaundiced.  ?   Findings: No rash.  ?Neurological:  ?   Mental Status: She is alert.  ?Psychiatric:     ?   Mood and Affect: Mood  normal.     ?   Behavior: Behavior normal.  ? ?   ?Results for orders placed or performed in visit on 11/13/21  ?Urine Culture  ? Specimen: Urine  ?Result Value Ref Range  ? MICRO NUMBER: [id number]   ? SPECIMEN QUALITY: Adequate   ? Sample Source NOT GIVEN   ? STATUS: FINAL   ? ISOLATE 1: Escherichia coli (A)   ?    Susceptibility  ? Escherichia coli - URINE CULTURE, REFLEX  ?  AMOX/CLAVULANIC 4 Sensitive   ?  AMPICILLIN >=32 Resistant   ?  AMPICILLIN/SULBACTAM 16 Intermediate   ?  CEFAZOLIN* <=4 Not Reportable   ?   * For infections other than uncomplicated UTI ?caused by E. coli, K. pneumoniae or P. mirabilis: ?Cefazolin is resistant if MIC > or = 8 mcg/mL. ?(Distinguishing susceptible versus intermediate ?for isolates with MIC < or = 4 mcg/mL requires ?additional testing.) ?For uncomplicated UTI caused by E. coli, ?K. pneumoniae or P. mirabilis: Cefazolin is ?susceptible if MIC <32 mcg/mL and predicts ?susceptible to the oral agents cefaclor, cefdinir, ?cefpodoxime, cefprozil, cefuroxime, cephalexin ?and loracarbef. ?  ?  CEFTAZIDIME <=1 Sensitive   ?  CEFEPIME <=1 Sensitive   ?  CEFTRIAXONE <=1 Sensitive   ?  CIPROFLOXACIN <=0.25 Sensitive   ?  LEVOFLOXACIN <=0.12 Sensitive   ?  GENTAMICIN <=1 Sensitive   ?  IMIPENEM <=0.25 Sensitive   ?  NITROFURANTOIN <=16 Sensitive   ?  PIP/TAZO <=4 Sensitive   ?  TOBRAMYCIN <=1 Sensitive   ?  TRIMETH/SULFA* <=20 Sensitive   ?   * For infections other than uncomplicated UTI ?caused by E. coli, K. pneumoniae or P. mirabilis: ?Cefazolin is resistant if MIC > or = 8 mcg/mL. ?(Distinguishing susceptible versus intermediate ?for isolates with MIC < or = 4 mcg/mL requires ?additional testing.) ?For uncomplicated UTI caused by E. coli, ?K. pneumoniae or P. mirabilis: Cefazolin is ?susceptible if MIC <32 mcg/mL and predicts ?susceptible to the oral agents cefaclor, cefdinir, ?cefpodoxime, cefprozil, cefuroxime, cephalexin ?and loracarbef. ?Legend: ?S = Susceptible  I = Intermediate ?R  = Resistant  NS = Not susceptible ?* = Not tested  NR = Not reported ?**NN = See antimicrobic comments ?  ?POCT glycosylated hemoglobin (Hb A1C)  ?Result Value Ref Range  ? Hemoglobin A1C 5.2 4.0 - 5.6 %  ? HbA1c POC (<> result, manual entry)    ? HbA1c, POC (prediabetic range)    ? HbA1c, POC (controlled diabetic range)    ?POCT Urinalysis Dipstick (Automated)  ?Result Value Ref Range  ? Color, UA yellow   ? Clarity, UA clear   ?  Glucose, UA Negative Negative  ? Bilirubin, UA negative   ? Ketones, UA negative   ? Spec Grav, UA 1.015 1.010 - 1.025  ? Blood, UA 1+   ? pH, UA 6.0 5.0 - 8.0  ? Protein, UA Positive (A) Negative  ? Urobilinogen, UA 0.2 0.2 or 1.0 E.U./dL  ? Nitrite, UA negative   ? Leukocytes, UA Trace (A) Negative  ? ? ?Assessment & Plan:  ?This visit occurred during the SARS-CoV-2 public health emergency.  Safety protocols were in place, including screening questions prior to the visit, additional usage of staff PPE, and extensive cleaning of exam room while observing appropriate contact time as indicated for disinfecting solutions.  ? ?Problem List Items Addressed This Visit   ? ? UTI (urinary tract infection)  ?  >100k E coli UTI sensitive to bactrim, cipro, keflex, nitrofurantoin.  ?Urinary symptoms already improved after 1.5 days of bactrim.  ?Given RUQ and epigatric abd pain, stop bactrim (can be associated with hepatotoxicity) and start in its place keflex 250mg  QID x5d.  ?Update if ongoing UTI symptoms after treatment.  ?  ?  ? Relevant Medications  ? cephALEXin (KEFLEX) 250 MG capsule  ? Generalized abdominal pain - Primary  ?  New, in setting of Mounjaro use (no recent dose changes) and recent UTI initially treated with bactrim, now starting keflex in its place. Symptoms not consistent with pyelonephritis.  ?In [place] use, ?pancreatitis vs biliary/liver disease - check STAT labs today (CBC, CMP, lipase and amylase), rec clear liquid diet for next 24 hours. many of these symptoms can be  attributed to Mounjaro (bloating with abdominal distension) - rec hold next Mounjaro dose (Sunday).  ?Update with how she feels over next 24-48 hours.  ?  ?  ? Relevant Orders  ? Amylase  ? CBC with Differential/Plat

## 2021-11-17 NOTE — Telephone Encounter (Signed)
I spoke with pt; pt said she started the bactrim on 11/16/21 and pt can see improvement in urinary symptoms; urine color is light yellow now, does not see blood in urine now; no urgency of urine and odor of urine is gone, pelvic and lt side discomfort is gone. Pt said over weekend noticed swelling of upper abd above waist line; pt said I look pregnant. Pt said last wk on and off had some difficulty getting breath but was sporadic and pt did not mention at appt on Fri. Pt said now the difficulty in breathing is basically constant. Pt is not having any CP. No covid symptoms per pt. Pt scheduled appt on 11/17/21 at 4 PM. UC & ED precautions given and pt voiced understanding. I spoke with Dr Darnell Level who said pt did need appt for eval. Sending note to Dr Darnell Level and Lattie Haw CMA. ?

## 2021-12-10 DIAGNOSIS — N926 Irregular menstruation, unspecified: Secondary | ICD-10-CM | POA: Diagnosis not present

## 2021-12-10 DIAGNOSIS — Z124 Encounter for screening for malignant neoplasm of cervix: Secondary | ICD-10-CM | POA: Diagnosis not present

## 2021-12-10 DIAGNOSIS — E282 Polycystic ovarian syndrome: Secondary | ICD-10-CM | POA: Diagnosis not present

## 2021-12-10 DIAGNOSIS — Z01419 Encounter for gynecological examination (general) (routine) without abnormal findings: Secondary | ICD-10-CM | POA: Diagnosis not present

## 2022-01-28 DIAGNOSIS — N926 Irregular menstruation, unspecified: Secondary | ICD-10-CM | POA: Diagnosis not present

## 2022-01-28 DIAGNOSIS — Z01818 Encounter for other preprocedural examination: Secondary | ICD-10-CM | POA: Diagnosis not present

## 2022-03-29 LAB — HM DIABETES EYE EXAM

## 2022-03-30 ENCOUNTER — Encounter: Payer: Self-pay | Admitting: Family Medicine

## 2022-05-12 ENCOUNTER — Other Ambulatory Visit: Payer: Self-pay | Admitting: Family Medicine

## 2022-05-12 ENCOUNTER — Other Ambulatory Visit (INDEPENDENT_AMBULATORY_CARE_PROVIDER_SITE_OTHER): Payer: BC Managed Care – PPO

## 2022-05-12 DIAGNOSIS — E785 Hyperlipidemia, unspecified: Secondary | ICD-10-CM

## 2022-05-12 DIAGNOSIS — E118 Type 2 diabetes mellitus with unspecified complications: Secondary | ICD-10-CM | POA: Diagnosis not present

## 2022-05-12 LAB — COMPREHENSIVE METABOLIC PANEL
ALT: 11 U/L (ref 0–35)
AST: 12 U/L (ref 0–37)
Albumin: 4.1 g/dL (ref 3.5–5.2)
Alkaline Phosphatase: 51 U/L (ref 39–117)
BUN: 11 mg/dL (ref 6–23)
CO2: 26 mEq/L (ref 19–32)
Calcium: 9 mg/dL (ref 8.4–10.5)
Chloride: 105 mEq/L (ref 96–112)
Creatinine, Ser: 0.72 mg/dL (ref 0.40–1.20)
GFR: 101.79 mL/min (ref >60.00)
Glucose, Bld: 82 mg/dL (ref 70–99)
Potassium: 4.3 mEq/L (ref 3.5–5.1)
Sodium: 137 mEq/L (ref 135–145)
Total Bilirubin: 0.4 mg/dL (ref 0.2–1.2)
Total Protein: 7.6 g/dL (ref 6.0–8.3)

## 2022-05-12 LAB — LIPID PANEL
Cholesterol: 168 mg/dL (ref 0–200)
HDL: 34.9 mg/dL — ABNORMAL LOW (ref >39.00)
NonHDL: 133.32
Total CHOL/HDL Ratio: 5
Triglycerides: 268 mg/dL — ABNORMAL HIGH (ref 0.0–149.0)
VLDL: 53.6 mg/dL — ABNORMAL HIGH (ref 0.0–40.0)

## 2022-05-12 LAB — MICROALBUMIN / CREATININE URINE RATIO
Creatinine,U: 229.9 mg/dL
Microalb Creat Ratio: 2.1 mg/g (ref 0.0–30.0)
Microalb, Ur: 4.8 mg/dL — ABNORMAL HIGH (ref 0.0–1.9)

## 2022-05-12 LAB — HEMOGLOBIN A1C: Hgb A1c MFr Bld: 5.4 % (ref 4.6–6.5)

## 2022-05-12 LAB — LDL CHOLESTEROL, DIRECT: Direct LDL: 108 mg/dL

## 2022-05-21 ENCOUNTER — Encounter: Payer: Self-pay | Admitting: Family Medicine

## 2022-05-21 ENCOUNTER — Ambulatory Visit (INDEPENDENT_AMBULATORY_CARE_PROVIDER_SITE_OTHER): Payer: BC Managed Care – PPO | Admitting: Family Medicine

## 2022-05-21 VITALS — BP 124/88 | HR 79 | Temp 97.5°F | Ht 63.5 in | Wt 177.1 lb

## 2022-05-21 DIAGNOSIS — R6889 Other general symptoms and signs: Secondary | ICD-10-CM | POA: Diagnosis not present

## 2022-05-21 DIAGNOSIS — E669 Obesity, unspecified: Secondary | ICD-10-CM

## 2022-05-21 DIAGNOSIS — Z0000 Encounter for general adult medical examination without abnormal findings: Secondary | ICD-10-CM | POA: Diagnosis not present

## 2022-05-21 DIAGNOSIS — E118 Type 2 diabetes mellitus with unspecified complications: Secondary | ICD-10-CM

## 2022-05-21 DIAGNOSIS — I10 Essential (primary) hypertension: Secondary | ICD-10-CM

## 2022-05-21 DIAGNOSIS — R140 Abdominal distension (gaseous): Secondary | ICD-10-CM

## 2022-05-21 DIAGNOSIS — N926 Irregular menstruation, unspecified: Secondary | ICD-10-CM | POA: Diagnosis not present

## 2022-05-21 DIAGNOSIS — E282 Polycystic ovarian syndrome: Secondary | ICD-10-CM

## 2022-05-21 DIAGNOSIS — Z1159 Encounter for screening for other viral diseases: Secondary | ICD-10-CM

## 2022-05-21 DIAGNOSIS — E785 Hyperlipidemia, unspecified: Secondary | ICD-10-CM

## 2022-05-21 LAB — CBC WITH DIFFERENTIAL/PLATELET
Basophils Absolute: 0 10*3/uL (ref 0.0–0.1)
Basophils Relative: 0.6 % (ref 0.0–3.0)
Eosinophils Absolute: 0 10*3/uL (ref 0.0–0.7)
Eosinophils Relative: 0.3 % (ref 0.0–5.0)
HCT: 34.8 % — ABNORMAL LOW (ref 36.0–46.0)
Hemoglobin: 11.8 g/dL — ABNORMAL LOW (ref 12.0–15.0)
Lymphocytes Relative: 32 % (ref 12.0–46.0)
Lymphs Abs: 2.1 10*3/uL (ref 0.7–4.0)
MCHC: 34 g/dL (ref 30.0–36.0)
MCV: 92.5 fl (ref 78.0–100.0)
Monocytes Absolute: 0.4 10*3/uL (ref 0.1–1.0)
Monocytes Relative: 5.5 % (ref 3.0–12.0)
Neutro Abs: 4 10*3/uL (ref 1.4–7.7)
Neutrophils Relative %: 61.6 % (ref 43.0–77.0)
Platelets: 219 10*3/uL (ref 150.0–400.0)
RBC: 3.76 Mil/uL — ABNORMAL LOW (ref 3.87–5.11)
RDW: 12.8 % (ref 11.5–15.5)
WBC: 6.4 10*3/uL (ref 4.0–10.5)

## 2022-05-21 LAB — TSH: TSH: 1.81 u[IU]/mL (ref 0.35–5.50)

## 2022-05-21 LAB — LIPASE: Lipase: 22 U/L (ref 11.0–59.0)

## 2022-05-21 MED ORDER — TIRZEPATIDE 7.5 MG/0.5ML ~~LOC~~ SOAJ: 7.5000 mg | SUBCUTANEOUS | 3 refills | Status: DC

## 2022-05-21 NOTE — Assessment & Plan Note (Signed)
Notes periods becoming more regular since mounjaro use.  Saw GYN - considered EMB but [place] returned reassuring.

## 2022-05-21 NOTE — Assessment & Plan Note (Signed)
Significant abdominal bloating that seems to be temporally related to period. Will drop mounjaro dose, check lipase today, update if ongoing.

## 2022-05-21 NOTE — Assessment & Plan Note (Signed)
Chronic, off medication. Reviewed diet choices to control triglycerides.  The 10-year ASCVD risk score (Arnett DK, et al., 2019) is: 2.1%   Values used to calculate the score:     Age: 46 years     Sex: Female     Is Non-Hispanic African American: No     Diabetic: Yes     Tobacco smoker: No     Systolic Blood Pressure: 448 mmHg     Is BP treated: No     HDL Cholesterol: 34.9 mg/dL     Total Cholesterol: 168 mg/dL

## 2022-05-21 NOTE — Patient Instructions (Signed)
Labs today  Drop mounjaro to 7.5mg  weekly. Stay off blood pressure medicine for now, continue monitoring at home.  Keep [place] updated with how you're feeling.  Return in 6 months for diabetes check.   Health Maintenance, Female Adopting a healthy lifestyle and getting preventive care are important in promoting health and wellness. Ask your health care provider about: The right schedule for you to have regular tests and exams. Things you can do on your own to prevent diseases and keep yourself healthy. What should I know about diet, weight, and exercise? Eat a healthy diet  Eat a diet that includes plenty of vegetables, fruits, low-fat dairy products, and lean protein. Do not eat a lot of foods that are high in solid fats, added sugars, or sodium. Maintain a healthy weight Body mass index (BMI) is used to identify weight problems. It estimates body fat based on height and weight. Your health care provider can help determine your BMI and help you achieve or maintain a healthy weight. Get regular exercise Get regular exercise. This is one of the most important things you can do for your health. Most adults should: Exercise for at least 150 minutes each week. The exercise should increase your heart rate and make you sweat (moderate-intensity exercise). Do strengthening exercises at least twice a week. This is in addition to the moderate-intensity exercise. Spend less time sitting. Even light physical activity can be beneficial. Watch cholesterol and blood lipids Have your blood tested for lipids and cholesterol at 44 years of age, then have this test every 5 years. Have your cholesterol levels checked more often if: Your lipid or cholesterol levels are high. You are older than 44 years of age. You are at high risk for heart disease. What should I know about cancer screening? Depending on your health history and family history, you may need to have cancer screening at various ages. This may  include screening for: Breast cancer. Cervical cancer. Colorectal cancer. Skin cancer. Lung cancer. What should I know about heart disease, diabetes, and high blood pressure? Blood pressure and heart disease High blood pressure causes heart disease and increases the risk of stroke. This is more likely to develop in people who have high blood pressure readings or are overweight. Have your blood pressure checked: Every 3-5 years if you are 38-5 years of age. Every year if you are 36 years old or older. Diabetes Have regular diabetes screenings. This checks your fasting blood sugar level. Have the screening done: Once every three years after age 78 if you are at a normal weight and have a low risk for diabetes. More often and at a younger age if you are overweight or have a high risk for diabetes. What should I know about preventing infection? Hepatitis B If you have a higher risk for hepatitis B, you should be screened for this virus. Talk with your health care provider to find out if you are at risk for hepatitis B infection. Hepatitis C Testing is recommended for: Everyone born from [birth year] through 1965. Anyone with known risk factors for hepatitis C. Sexually transmitted infections (STIs) Get screened for STIs, including gonorrhea and chlamydia, if: You are sexually active and are younger than 44 years of age. You are older than 44 years of age and your health care provider tells you that you are at risk for this type of infection. Your sexual activity has changed since you were last screened, and you are at increased risk for chlamydia or gonorrhea.  Ask your health care provider if you are at risk. Ask your health care provider about whether you are at high risk for HIV. Your health care provider may recommend a prescription medicine to help prevent HIV infection. If you choose to take medicine to prevent HIV, you should first get tested for HIV. You should then be tested every 3 months  for as long as you are taking the medicine. Pregnancy If you are about to stop having your period (premenopausal) and you may become pregnant, seek counseling before you get pregnant. Take 400 to 800 micrograms (mcg) of folic acid every day if you become pregnant. Ask for birth control (contraception) if you want to prevent pregnancy. Osteoporosis and menopause Osteoporosis is a disease in which the bones lose minerals and strength with aging. This can result in bone fractures. If you are 53 years old or older, or if you are at risk for osteoporosis and fractures, ask your health care provider if you should: Be screened for bone loss. Take a calcium or vitamin D supplement to lower your risk of fractures. Be given hormone replacement therapy (HRT) to treat symptoms of menopause. Follow these instructions at home: Alcohol use Do not drink alcohol if: Your health care provider tells you not to drink. You are pregnant, may be pregnant, or are planning to become pregnant. If you drink alcohol: Limit how much you have to: 0-1 drink a day. Know how much alcohol is in your drink. In the [place] one drink equals one 12 oz bottle of beer (355 mL), one 5 oz glass of wine (148 mL), or one 1 oz glass of hard liquor (44 mL). Lifestyle Do not use any products that contain nicotine or tobacco. These products include cigarettes, chewing tobacco, and vaping devices, such as e-cigarettes. If you need help quitting, ask your health care provider. Do not use street drugs. Do not share needles. Ask your health care provider for help if you need support or information about quitting drugs. General instructions Schedule regular health, dental, and eye exams. Stay current with your vaccines. Tell your health care provider if: You often feel depressed. You have ever been abused or do not feel safe at home. Summary Adopting a healthy lifestyle and getting preventive care are important in promoting health and  wellness. Follow your health care provider's instructions about healthy diet, exercising, and getting tested or screened for diseases. Follow your health care provider's instructions on monitoring your cholesterol and blood pressure. This information is not intended to replace advice given to you by your health care provider. Make sure you discuss any questions you have with your health care provider. Document Revised: 01/05/2021 Document Reviewed: 01/05/2021 Elsevier Patient Education  [place].

## 2022-05-21 NOTE — Assessment & Plan Note (Signed)
Chronic, great control since starting mounjaro - continue this.  Latest A1c 5.4%.  Will drop dose from 10mg  to 7.5mg  weekly given recent abdominal bloating. Will also check lipase.

## 2022-05-21 NOTE — Assessment & Plan Note (Signed)
Continue to encourage healthy diet and lifestyle choices.  

## 2022-05-21 NOTE — Assessment & Plan Note (Signed)
Preventative protocols reviewed and updated unless pt declined. Discussed healthy diet and lifestyle.  

## 2022-05-21 NOTE — Progress Notes (Signed)
Patient ID: Nikkole Placzek, female    DOB: 22-Dec-1975, 46 y.o.   MRN: [id number]  This visit was conducted in person.  BP 124/88   Pulse 79   Temp (!) 97.5 F (36.4 C) (Temporal)   Ht 5' 3.5" (1.613 m)   Wt 177 lb 2 oz (80.3 kg)   LMP 05/12/2022   SpO2 98%   BMI 30.88 kg/m    CC: CPE Subjective:   HPI: Lulu Hirschmann is a 46 y.o. female presenting on 05/21/2022 for Annual Exam (Wants to discuss BP meds. )   HTN - was on amlodipine 5mg  daily as well as losartan 100mg  daily and spironolactone 100mg  daily (through derm) - she's actually stopped medications for the past 2 weeks with stable blood pressures at home. She started magnesium supplements.   DM - has felt occasional bloating possibly related to periods however localizes to epigastric region. She's continued mounjaro 10mg  weekly, didn't take this week. She's been taking align probiotic, de-bloating pills. She does not periods restarting since starting mounjaro.   Preventative: Well woman exam - OBGYN in [place], normal paps, latest 11/2021. Known PCOS. Recent US returned normla.  LMP - restarting this year with mounjaro mammo - 06/2021 at [facility].  DEXA scan - not due  Flu shot - declines  COVID vaccine - Pfizer 11/2019, 12/2019, booster 07/2020  Tdap 2015 Pneumonia shot - not due Shingrix - not due  Advanced directive discussion -  Seat belt use discussed  Sunscreen use discussed. No changing moles on skin. Sleep - averages 7-8 hours of sleep/night Non smoker  Alcohol - occasional glass of wine  Dentist - q6 mo  Eye exam - yearly   Caffeine: 2 cups coffee/day  Lives with husband, son (2005), 2 twins (2012), outdoor Neurosurgeon  Occupation: Public relations account executive at [employer]  Edu: BS in business administration  Activity: walking 2x/wk on treadmill for 20 min  Diet: good water, fruits/vegetables daily      Relevant past medical, surgical, family and social history reviewed and updated as indicated. Interim  medical history since our last visit reviewed. Allergies and medications reviewed and updated. Outpatient Medications Prior to Visit  Medication Sig Dispense Refill   amLODipine (NORVASC) 5 MG tablet Take 1 tablet (5 mg total) by mouth daily. 90 tablet 3   cephALEXin (KEFLEX) 250 MG capsule Take 1 capsule (250 mg total) by mouth 4 (four) times daily. 20 capsule 0   losartan (COZAAR) 100 MG tablet Take 1 tablet (100 mg total) by mouth daily. 90 tablet 3   spironolactone (ALDACTONE) 100 MG tablet Take 1 tablet (100 mg total) by mouth daily. Through dermatology     tirzepatide [facility]) 10 MG/0.5ML Pen Inject 10 mg into the skin once a week. 6 mL 1   No facility-administered medications prior to visit.     Per HPI unless specifically indicated in ROS section below Review of Systems  Constitutional:  Negative for activity change, appetite change, chills, fatigue, fever and unexpected weight change.  HENT:  Negative for hearing loss.   Eyes:  Negative for visual disturbance.  Respiratory:  Positive for shortness of breath (bloating related). Negative for cough, chest tightness and wheezing.   Cardiovascular:  Negative for chest pain, palpitations and leg swelling.  Gastrointestinal:  Negative for abdominal distention, abdominal pain, blood in stool, constipation, diarrhea, nausea and vomiting.       Significant bloating  Genitourinary:  Negative for difficulty urinating and hematuria.  Musculoskeletal:  Negative for arthralgias, myalgias and neck pain.  Skin:  Negative for rash.  Neurological:  Negative for dizziness, seizures, syncope and headaches.  Hematological:  Negative for adenopathy. Does not bruise/bleed easily.  Psychiatric/Behavioral:  Negative for dysphoric mood. The patient is not nervous/anxious.     Objective:  BP 124/88   Pulse 79   Temp (!) 97.5 F (36.4 C) (Temporal)   Ht 5' 3.5" (1.613 m)   Wt 177 lb 2 oz (80.3 kg)   LMP 05/12/2022   SpO2 98%   BMI 30.88 kg/m    Wt Readings from Last 3 Encounters:  05/21/22 177 lb 2 oz (80.3 kg)  11/17/21 181 lb 2 oz (82.2 kg)  11/13/21 180 lb 6 oz (81.8 kg)      Physical Exam Vitals and nursing note reviewed.  Constitutional:      Appearance: Normal appearance. She is not ill-appearing.  HENT:     Head: Normocephalic and atraumatic.     Right Ear: Tympanic membrane, ear canal and external ear normal. There is no impacted cerumen.     Left Ear: Tympanic membrane, ear canal and external ear normal. There is no impacted cerumen.  Eyes:     General:        Right eye: No discharge.        Left eye: No discharge.     Extraocular Movements: Extraocular movements intact.     Conjunctiva/sclera: Conjunctivae normal.     Pupils: Pupils are equal, round, and reactive to light.  Neck:     Thyroid: No thyroid mass or thyromegaly.  Cardiovascular:     Rate and Rhythm: Normal rate and regular rhythm.     Pulses: Normal pulses.     Heart sounds: Normal heart sounds. No murmur heard. Pulmonary:     Effort: Pulmonary effort is normal. No respiratory distress.     Breath sounds: Normal breath sounds. No wheezing, rhonchi or rales.  Abdominal:     General: Bowel sounds are normal. There is no distension.     Palpations: Abdomen is soft. There is no mass.     Tenderness: There is no abdominal tenderness. There is no guarding or rebound.     Hernia: No hernia is present.  Musculoskeletal:     Cervical back: Normal range of motion and neck supple. No rigidity.     Right lower leg: No edema.     Left lower leg: No edema.  Lymphadenopathy:     Cervical: No cervical adenopathy.  Skin:    General: Skin is warm and dry.     Findings: No rash.  Neurological:     General: No focal deficit present.     Mental Status: She is alert. Mental status is at baseline.  Psychiatric:        Mood and Affect: Mood normal.        Behavior: Behavior normal.       Results for orders placed or performed in visit on 05/12/22   Microalbumin / creatinine urine ratio  Result Value Ref Range   Microalb, Ur 4.8 (H) 0.0 - 1.9 mg/dL   Creatinine,U 865.7 mg/dL   Microalb Creat Ratio 2.1 0.0 - 30.0 mg/g  Hemoglobin A1c  Result Value Ref Range   Hgb A1c MFr Bld 5.4 4.6 - 6.5 %  Comprehensive metabolic panel  Result Value Ref Range   Sodium 137 135 - 145 mEq/L   Potassium 4.3 3.5 - 5.1 mEq/L   Chloride 105 96 - 112  mEq/L   CO2 26 19 - 32 mEq/L   Glucose, Bld 82 70 - 99 mg/dL   BUN 11 6 - 23 mg/dL   Creatinine, Ser 8.29 0.40 - 1.20 mg/dL   Total Bilirubin 0.4 0.2 - 1.2 mg/dL   Alkaline Phosphatase 51 39 - 117 U/L   AST 12 0 - 37 U/L   ALT 11 0 - 35 U/L   Total Protein 7.6 6.0 - 8.3 g/dL   Albumin 4.1 3.5 - 5.2 g/dL   GFR 937.16 >96.78 mL/min   Calcium 9.0 8.4 - 10.5 mg/dL  Lipid panel  Result Value Ref Range   Cholesterol 168 0 - 200 mg/dL   Triglycerides 938.1 (H) 0.0 - 149.0 mg/dL   HDL 01.75 (L) >10.25 mg/dL   VLDL 85.2 (H) 0.0 - 77.8 mg/dL   Total CHOL/HDL Ratio 5    NonHDL 133.32   LDL cholesterol, direct  Result Value Ref Range   Direct LDL 108.0 mg/dL    Assessment & Plan:   Problem List Items Addressed This Visit     Healthcare maintenance - Primary (Chronic)    Preventative protocols reviewed and updated unless pt declined. Discussed healthy diet and lifestyle.       Irregular periods    Notes periods becoming more regular since mounjaro use.  Saw GYN - considered EMB but [place] returned reassuring.       Obesity, Class I, BMI 30-34.9    Continue to encourage healthy diet and lifestyle choices.       Dyslipidemia    Chronic, off medication. Reviewed diet choices to control triglycerides.  The 10-year ASCVD risk score (Arnett DK, et al., 2019) is: 2.1%   Values used to calculate the score:     Age: 42 years     Sex: Female     Is Non-Hispanic African American: No     Diabetic: Yes     Tobacco smoker: No     Systolic Blood Pressure: 124 mmHg     Is BP treated: No     HDL  Cholesterol: 34.9 mg/dL     Total Cholesterol: 168 mg/dL       PCOS (polycystic ovarian syndrome)   Hypertension    Chronic, stable period off medication for the past 2 weeks - she will stay off antihypertensives, monitoring BP and let me know if start trending up.       Controlled diabetes mellitus type 2 with complications (HCC)    Chronic, great control since starting mounjaro - continue this.  Latest A1c 5.4%.  Will drop dose from 10mg  to 7.5mg  weekly given recent abdominal bloating. Will also check lipase.       Relevant Medications   tirzepatide (MOUNJARO) 7.5 MG/0.5ML Pen   Other Relevant Orders   Lipase   Abdominal bloating    Significant abdominal bloating that seems to be temporally related to period. Will drop mounjaro dose, check lipase today, update if ongoing.       Relevant Orders   Lipase   CBC with Differential/Platelet   Other Visit Diagnoses     Cold intolerance       Relevant Orders   CBC with Differential/Platelet   TSH   Need for hepatitis C screening test       Relevant Orders   Hepatitis C antibody        Meds ordered this encounter  Medications   DISCONTD: tirzepatide (MOUNJARO) 7.5 MG/0.5ML Pen    Sig: Inject 7.5 mg into  the skin once a week.    Dispense:  6 mL    Refill:  3   tirzepatide (MOUNJARO) 7.5 MG/0.5ML Pen    Sig: Inject 7.5 mg into the skin once a week.    Dispense:  6 mL    Refill:  3   Orders Placed This Encounter  Procedures   Lipase   CBC with Differential/Platelet   TSH   Hepatitis C antibody     Patient instructions: Labs today  Drop mounjaro to 7.5mg  weekly. Stay off blood pressure medicine for now, continue monitoring at home.  Keep us updated with how you're feeling.  Return in 6 months for diabetes check.   Follow up plan: Return in about 6 months (around 11/19/2022) for follow up visit.  Eustaquio BoydenJavier Demarrius Guerrero, MD

## 2022-05-21 NOTE — Assessment & Plan Note (Addendum)
Chronic, stable period off medication for the past 2 weeks - she will stay off antihypertensives, monitoring BP and let me know if start trending up.

## 2022-05-24 ENCOUNTER — Other Ambulatory Visit: Payer: Self-pay

## 2022-05-24 ENCOUNTER — Other Ambulatory Visit (INDEPENDENT_AMBULATORY_CARE_PROVIDER_SITE_OTHER): Payer: BC Managed Care – PPO

## 2022-05-24 DIAGNOSIS — D509 Iron deficiency anemia, unspecified: Secondary | ICD-10-CM

## 2022-05-24 LAB — IRON: Iron: 94 ug/dL (ref 42–145)

## 2022-05-24 LAB — HEPATITIS C ANTIBODY: Hepatitis C Ab: NONREACTIVE

## 2022-05-24 NOTE — Addendum Note (Signed)
Addended by: Ruben Im on: 05/24/2022 03:00 PM   Modules accepted: Orders

## 2022-06-10 ENCOUNTER — Telehealth: Payer: Self-pay

## 2022-06-10 NOTE — Telephone Encounter (Signed)
Patient stated she takes 7.5MG /0.51ml, not the 2.5MG /0.7ml that was sent over in the prior auth.  I called [facility] at [phone].  They stated that they do list the prescription as 7.5MG /0.5 ml.  This prescription was last filled on 05/21/22, so it is too early to run it through for authorization.  This must have been an automatically sent PA for a previous strength of the medication.  Sent patient msg about this via mychart.

## 2022-06-10 NOTE — Telephone Encounter (Signed)
Prior auth started for [facility] 2.5MG /0.5ML pen-injectors. George Hugh Key: [facility] - [place] Case ID: [id number] Per Cover My Meds:  This request has been approved using information available on the patient's profile. Status:  Approved; Review Type:  Prior Auth; Coverage Start Date: 05/11/2022; Coverage End Date:  06/10/2023  Patient notified via mychart.

## 2022-06-26 ENCOUNTER — Encounter: Payer: Self-pay | Admitting: Family Medicine

## 2022-06-28 MED ORDER — LOSARTAN POTASSIUM 50 MG PO TABS: 50.0000 mg | ORAL_TABLET | Freq: Every day | ORAL | 3 refills | Status: DC

## 2022-11-19 ENCOUNTER — Encounter: Payer: Self-pay | Admitting: Family Medicine

## 2022-11-19 ENCOUNTER — Ambulatory Visit (INDEPENDENT_AMBULATORY_CARE_PROVIDER_SITE_OTHER): Payer: BC Managed Care – PPO | Admitting: Family Medicine

## 2022-11-19 VITALS — BP 118/74 | HR 88 | Temp 97.2°F | Ht 63.5 in | Wt 176.4 lb

## 2022-11-19 DIAGNOSIS — R6889 Other general symptoms and signs: Secondary | ICD-10-CM | POA: Diagnosis not present

## 2022-11-19 DIAGNOSIS — N926 Irregular menstruation, unspecified: Secondary | ICD-10-CM | POA: Diagnosis not present

## 2022-11-19 DIAGNOSIS — I10 Essential (primary) hypertension: Secondary | ICD-10-CM

## 2022-11-19 DIAGNOSIS — E1169 Type 2 diabetes mellitus with other specified complication: Secondary | ICD-10-CM | POA: Diagnosis not present

## 2022-11-19 DIAGNOSIS — D649 Anemia, unspecified: Secondary | ICD-10-CM | POA: Diagnosis not present

## 2022-11-19 DIAGNOSIS — E118 Type 2 diabetes mellitus with unspecified complications: Secondary | ICD-10-CM

## 2022-11-19 LAB — FERRITIN: Ferritin: 258.3 ng/mL (ref 10.0–291.0)

## 2022-11-19 LAB — CBC WITH DIFFERENTIAL/PLATELET
Basophils Absolute: 0 10*3/uL (ref 0.0–0.1)
Basophils Relative: 0.6 % (ref 0.0–3.0)
Eosinophils Absolute: 0 10*3/uL (ref 0.0–0.7)
Eosinophils Relative: 0.4 % (ref 0.0–5.0)
HCT: 37 % (ref 36.0–46.0)
Hemoglobin: 12.5 g/dL (ref 12.0–15.0)
Lymphocytes Relative: 40 % (ref 12.0–46.0)
Lymphs Abs: 2.7 10*3/uL (ref 0.7–4.0)
MCHC: 33.9 g/dL (ref 30.0–36.0)
MCV: 93 fl (ref 78.0–100.0)
Monocytes Absolute: 0.4 10*3/uL (ref 0.1–1.0)
Monocytes Relative: 5.6 % (ref 3.0–12.0)
Neutro Abs: 3.7 10*3/uL (ref 1.4–7.7)
Neutrophils Relative %: 53.4 % (ref 43.0–77.0)
Platelets: 236 10*3/uL (ref 150.0–400.0)
RBC: 3.98 Mil/uL (ref 3.87–5.11)
RDW: 12.7 % (ref 11.5–15.5)
WBC: 6.8 10*3/uL (ref 4.0–10.5)

## 2022-11-19 LAB — POCT GLYCOSYLATED HEMOGLOBIN (HGB A1C): Hemoglobin A1C: 5.2 % (ref 4.0–5.6)

## 2022-11-19 LAB — FOLATE: Folate: 23.5 ng/mL (ref >5.9)

## 2022-11-19 LAB — IBC PANEL
Iron: 86 ug/dL (ref 42–145)
Saturation Ratios: 31.8 % (ref 20.0–50.0)
TIBC: 270.2 ug/dL (ref 250.0–450.0)
Transferrin: 193 mg/dL — ABNORMAL LOW (ref 212.0–360.0)

## 2022-11-19 LAB — VITAMIN B12: Vitamin B-12: 353 pg/mL (ref 211–911)

## 2022-11-19 MED ORDER — TIRZEPATIDE 5 MG/0.5ML ~~LOC~~ SOAJ: 5.0000 mg | SUBCUTANEOUS | 1 refills | Status: DC

## 2022-11-19 MED ORDER — MAGNESIUM HYDROXIDE 1200 MG PO CHEW: 3.0000 | CHEWABLE_TABLET | Freq: Every day | ORAL | Status: DC

## 2022-11-19 MED ORDER — MULTIVITAMIN ADULT PO TABS: 1.0000 | ORAL_TABLET | Freq: Every day | ORAL | Status: AC

## 2022-11-19 MED ORDER — FISH OIL 1000 MG PO CAPS: 3.0000 | ORAL_CAPSULE | Freq: Every evening | ORAL | 0 refills | Status: AC

## 2022-11-19 NOTE — Patient Instructions (Addendum)
Labs today.  You are doing well today Sugars are doing great! Let's drop mounjaro to 5mg  weekly - new dose sent to pharmacy. Return in 6 months for physical.

## 2022-11-19 NOTE — Assessment & Plan Note (Signed)
Noted last visit, as well as ongoing cold intolerance - update anemia panel today.

## 2022-11-19 NOTE — Assessment & Plan Note (Signed)
Periods have regularized with weight loss/mounjaro

## 2022-11-19 NOTE — Assessment & Plan Note (Signed)
Chronic, stable on losartan 50mg daily.  °

## 2022-11-19 NOTE — Assessment & Plan Note (Signed)
Chronic, great control on mounjaro 7.5mg  however notes occasional GI upset - will drop dose to 5mg  weekly.

## 2022-11-19 NOTE — Progress Notes (Signed)
Patient ID: Rachel Bailey, female    DOB: 07-Oct-1975, 47 y.o.   MRN: [id number]  This visit was conducted in person.  BP 118/74   Pulse 88   Temp (!) 97.2 F (36.2 C) (Temporal)   Ht 5' 3.5" (1.613 m)   Wt 176 lb 6 oz (80 kg)   LMP 10/28/2022   SpO2 99%   BMI 30.75 kg/m    CC: 6 mo DM f/u visit  Subjective:   HPI: Leilanii Philippi is a 47 y.o. female presenting on 11/19/2022 for Medical Management of Chronic Issues (Here for 6 mo DM f/u.)   LMP end of February - h/o PCOS however has had regular periods q monthly since 06/2022. Normal light periods.   HTN - continues losartan 50mg  daily. Last visit we stopped amlodipine due to low sugars after weight loss. Tolerating well. No HA, vision changes, CP/tightness, SOB, leg swelling. No low BP symptoms of dizziness or lightheadedness. Does check BP at home - well controlled at home.   Recent episodes of  vertigo when getting out of bed. She had dry heaving with improvement afterwards.   DM - does not regularly check sugars. Compliant with antihyperglycemic regimen which includes: mounjaro 7.5mg  weekly. Notes ongoing episodes of bloating/trouble bleeding. Denies low sugars or hypoglycemic symptoms. Denies paresthesias, blurry vision. Last diabetic eye exam 02/2022. Glucometer brand: uses husband's. Last foot exam: 10/2021 - due. DSME: declined.  Lab Results  Component Value Date   HGBA1C 5.2 11/19/2022   Diabetic Foot Exam - Simple   Simple Foot Form Diabetic Foot exam was performed with the following findings: Yes 11/19/2022  8:21 AM  Visual Inspection No deformities, no ulcerations, no other skin breakdown bilaterally: Yes Sensation Testing Intact to touch and monofilament testing bilaterally: Yes Pulse Check See comments: Yes Comments Diminished pulses bilaterally    Lab Results  Component Value Date   MICROALBUR 4.8 (H) 05/12/2022         Relevant past medical, surgical, family and social history reviewed and updated as indicated.  Interim medical history since our last visit reviewed. Allergies and medications reviewed and updated. Outpatient Medications Prior to Visit  Medication Sig Dispense Refill   losartan (COZAAR) 50 MG tablet Take 1 tablet (50 mg total) by mouth daily. 90 tablet 3   tirzepatide (MOUNJARO) 7.5 MG/0.5ML Pen Inject 7.5 mg into the skin once a week. 6 mL 3   No facility-administered medications prior to visit.     Per HPI unless specifically indicated in ROS section below Review of Systems  Objective:  BP 118/74   Pulse 88   Temp (!) 97.2 F (36.2 C) (Temporal)   Ht 5' 3.5" (1.613 m)   Wt 176 lb 6 oz (80 kg)   LMP 10/28/2022   SpO2 99%   BMI 30.75 kg/m   Wt Readings from Last 3 Encounters:  11/19/22 176 lb 6 oz (80 kg)  05/21/22 177 lb 2 oz (80.3 kg)  11/17/21 181 lb 2 oz (82.2 kg)      Physical Exam Vitals and nursing note reviewed.  Constitutional:      Appearance: Normal appearance. She is not ill-appearing.  Eyes:     Extraocular Movements: Extraocular movements intact.     Conjunctiva/sclera: Conjunctivae normal.     Pupils: Pupils are equal, round, and reactive to light.  Cardiovascular:     Rate and Rhythm: Normal rate and regular rhythm.     Pulses: Normal pulses.     Heart  sounds: Normal heart sounds. No murmur heard. Pulmonary:     Effort: Pulmonary effort is normal. No respiratory distress.     Breath sounds: Normal breath sounds. No wheezing, rhonchi or rales.  Musculoskeletal:     Right lower leg: No edema.     Left lower leg: No edema.  Skin:    General: Skin is warm and dry.     Findings: No rash.  Neurological:     Mental Status: She is alert.  Psychiatric:        Mood and Affect: Mood normal.        Behavior: Behavior normal.       Results for orders placed or performed in visit on 11/19/22  POCT glycosylated hemoglobin (Hb A1C)  Result Value Ref Range   Hemoglobin A1C 5.2 4.0 - 5.6 %   HbA1c POC (<> result, manual entry)     HbA1c, POC  (prediabetic range)     HbA1c, POC (controlled diabetic range)      Assessment & Plan:   Problem List Items Addressed This Visit     Irregular periods    Periods have regularized with weight loss/mounjaro      Hypertension    Chronic, stable on losartan 50mg  daily.       Type 2 diabetes mellitus with other specified complication (HCC) - Primary    Chronic, great control on mounjaro 7.5mg  however notes occasional GI upset - will drop dose to 5mg  weekly.      Relevant Medications   tirzepatide (MOUNJARO) 5 MG/0.5ML Pen   Other Relevant Orders   POCT glycosylated hemoglobin (Hb A1C) (Completed)   Anemia    Noted last visit, as well as ongoing cold intolerance - update anemia panel today.       Relevant Orders   CBC with Differential/Platelet   Vitamin B12   Ferritin   IBC panel   Folate   Other Visit Diagnoses     Cold intolerance       Relevant Orders   CBC with Differential/Platelet        Meds ordered this encounter  Medications   Omega-3 Fatty Acids (FISH OIL) 1000 MG CAPS    Sig: Take 3 capsules (3,000 mg total) by mouth at bedtime.    Refill:  0   Magnesium Hydroxide 1200 MG CHEW    Sig: Chew 3 capsules by mouth daily.   Multiple Vitamin (MULTIVITAMIN ADULT) TABS    Sig: Take 1 tablet by mouth daily.   tirzepatide [facility]) 5 MG/0.5ML Pen    Sig: Inject 5 mg into the skin once a week.    Dispense:  6 mL    Refill:  1    Note new dose    Orders Placed This Encounter  Procedures   CBC with Differential/Platelet   Vitamin B12   Ferritin   IBC panel   Folate   POCT glycosylated hemoglobin (Hb A1C)    Patient Instructions  Labs today.  You are doing well today Sugars are doing great! Let's drop mounjaro to 5mg  weekly - new dose sent to pharmacy. Return in 6 months for physical.   Follow up plan: Return in about 6 months (around 05/22/2023) for annual exam, prior fasting for blood work.  Ria Bush, MD

## 2023-01-10 ENCOUNTER — Encounter: Payer: Self-pay | Admitting: Family Medicine

## 2023-01-14 MED ORDER — SCOPOLAMINE 1 MG/3DAYS TD PT72: 1.0000 | MEDICATED_PATCH | TRANSDERMAL | 0 refills | Status: DC

## 2023-02-09 LAB — HM DIABETES EYE EXAM

## 2023-02-10 ENCOUNTER — Encounter: Payer: Self-pay | Admitting: Family Medicine

## 2023-04-14 ENCOUNTER — Encounter (INDEPENDENT_AMBULATORY_CARE_PROVIDER_SITE_OTHER): Payer: Self-pay

## 2023-05-17 ENCOUNTER — Other Ambulatory Visit: Payer: Self-pay | Admitting: Family Medicine

## 2023-05-17 DIAGNOSIS — E1169 Type 2 diabetes mellitus with other specified complication: Secondary | ICD-10-CM

## 2023-05-17 DIAGNOSIS — E785 Hyperlipidemia, unspecified: Secondary | ICD-10-CM

## 2023-05-17 DIAGNOSIS — D649 Anemia, unspecified: Secondary | ICD-10-CM

## 2023-05-18 ENCOUNTER — Other Ambulatory Visit (INDEPENDENT_AMBULATORY_CARE_PROVIDER_SITE_OTHER): Payer: BC Managed Care – PPO

## 2023-05-18 DIAGNOSIS — E1169 Type 2 diabetes mellitus with other specified complication: Secondary | ICD-10-CM | POA: Diagnosis not present

## 2023-05-18 DIAGNOSIS — E785 Hyperlipidemia, unspecified: Secondary | ICD-10-CM

## 2023-05-18 DIAGNOSIS — D649 Anemia, unspecified: Secondary | ICD-10-CM

## 2023-05-18 LAB — LIPID PANEL
Cholesterol: 181 mg/dL (ref 0–200)
HDL: 37 mg/dL — ABNORMAL LOW (ref >39.00)
LDL Cholesterol: 89 mg/dL (ref 0–99)
NonHDL: 144.44
Total CHOL/HDL Ratio: 5
Triglycerides: 278 mg/dL — ABNORMAL HIGH (ref 0.0–149.0)
VLDL: 55.6 mg/dL — ABNORMAL HIGH (ref 0.0–40.0)

## 2023-05-18 LAB — COMPREHENSIVE METABOLIC PANEL WITH GFR
ALT: 19 U/L (ref 0–35)
AST: 12 U/L (ref 0–37)
Albumin: 4.2 g/dL (ref 3.5–5.2)
Alkaline Phosphatase: 42 U/L (ref 39–117)
BUN: 13 mg/dL (ref 6–23)
CO2: 26 meq/L (ref 19–32)
Calcium: 8.9 mg/dL (ref 8.4–10.5)
Chloride: 102 meq/L (ref 96–112)
Creatinine, Ser: 0.72 mg/dL (ref 0.40–1.20)
GFR: 101.07 mL/min (ref >60.00)
Glucose, Bld: 92 mg/dL (ref 70–99)
Potassium: 4 meq/L (ref 3.5–5.1)
Sodium: 134 meq/L — ABNORMAL LOW (ref 135–145)
Total Bilirubin: 0.6 mg/dL (ref 0.2–1.2)
Total Protein: 7.3 g/dL (ref 6.0–8.3)

## 2023-05-18 LAB — CBC WITH DIFFERENTIAL/PLATELET
Basophils Absolute: 0 10*3/uL (ref 0.0–0.1)
Basophils Relative: 0.6 % (ref 0.0–3.0)
Eosinophils Absolute: 0.1 10*3/uL (ref 0.0–0.7)
Eosinophils Relative: 0.8 % (ref 0.0–5.0)
HCT: 37.9 % (ref 36.0–46.0)
Hemoglobin: 12.7 g/dL (ref 12.0–15.0)
Lymphocytes Relative: 38.1 % (ref 12.0–46.0)
Lymphs Abs: 2.6 10*3/uL (ref 0.7–4.0)
MCHC: 33.4 g/dL (ref 30.0–36.0)
MCV: 93.2 fl (ref 78.0–100.0)
Monocytes Absolute: 0.4 10*3/uL (ref 0.1–1.0)
Monocytes Relative: 5.8 % (ref 3.0–12.0)
Neutro Abs: 3.7 10*3/uL (ref 1.4–7.7)
Neutrophils Relative %: 54.7 % (ref 43.0–77.0)
Platelets: 241 10*3/uL (ref 150.0–400.0)
RBC: 4.07 Mil/uL (ref 3.87–5.11)
RDW: 13.1 % (ref 11.5–15.5)
WBC: 6.7 10*3/uL (ref 4.0–10.5)

## 2023-05-18 LAB — MICROALBUMIN / CREATININE URINE RATIO
Creatinine,U: 152.3 mg/dL
Microalb Creat Ratio: 0.6 mg/g (ref 0.0–30.0)
Microalb, Ur: 0.9 mg/dL (ref 0.0–1.9)

## 2023-05-18 LAB — HEMOGLOBIN A1C: Hgb A1c MFr Bld: 5.4 % (ref 4.6–6.5)

## 2023-05-19 DIAGNOSIS — R92333 Mammographic heterogeneous density, bilateral breasts: Secondary | ICD-10-CM | POA: Diagnosis not present

## 2023-05-19 DIAGNOSIS — Z1231 Encounter for screening mammogram for malignant neoplasm of breast: Secondary | ICD-10-CM | POA: Diagnosis not present

## 2023-05-19 LAB — HM MAMMOGRAPHY

## 2023-05-25 ENCOUNTER — Encounter: Payer: Self-pay | Admitting: Family Medicine

## 2023-05-25 ENCOUNTER — Ambulatory Visit (INDEPENDENT_AMBULATORY_CARE_PROVIDER_SITE_OTHER): Payer: BC Managed Care – PPO | Admitting: Family Medicine

## 2023-05-25 VITALS — BP 120/84 | HR 76 | Temp 97.7°F | Ht 63.5 in | Wt 177.0 lb

## 2023-05-25 DIAGNOSIS — E785 Hyperlipidemia, unspecified: Secondary | ICD-10-CM

## 2023-05-25 DIAGNOSIS — Z0000 Encounter for general adult medical examination without abnormal findings: Secondary | ICD-10-CM | POA: Diagnosis not present

## 2023-05-25 DIAGNOSIS — Z1211 Encounter for screening for malignant neoplasm of colon: Secondary | ICD-10-CM

## 2023-05-25 DIAGNOSIS — I10 Essential (primary) hypertension: Secondary | ICD-10-CM

## 2023-05-25 DIAGNOSIS — E669 Obesity, unspecified: Secondary | ICD-10-CM | POA: Diagnosis not present

## 2023-05-25 DIAGNOSIS — Z7985 Long-term (current) use of injectable non-insulin antidiabetic drugs: Secondary | ICD-10-CM

## 2023-05-25 DIAGNOSIS — E282 Polycystic ovarian syndrome: Secondary | ICD-10-CM

## 2023-05-25 DIAGNOSIS — E1169 Type 2 diabetes mellitus with other specified complication: Secondary | ICD-10-CM

## 2023-05-25 MED ORDER — TIRZEPATIDE 7.5 MG/0.5ML ~~LOC~~ SOAJ: 7.5000 mg | SUBCUTANEOUS | 3 refills | Status: DC

## 2023-05-25 MED ORDER — LOSARTAN POTASSIUM 50 MG PO TABS: 50.0000 mg | ORAL_TABLET | Freq: Every day | ORAL | 4 refills | Status: DC

## 2023-05-25 NOTE — Patient Instructions (Addendum)
We will sign you up for Cologuard.  I've refilled Mounjaro at 7.5mg  weekly dose sent to local [facility] pharmacy Good to see you today Return in 6 months for follow up visit

## 2023-05-25 NOTE — Assessment & Plan Note (Signed)
Periods have somewhat normalized since weight loss.

## 2023-05-25 NOTE — Assessment & Plan Note (Signed)
Chronic, stable on losartan - continue.

## 2023-05-25 NOTE — Assessment & Plan Note (Signed)
Preventative protocols reviewed and updated unless pt declined. Discussed healthy diet and lifestyle.  

## 2023-05-25 NOTE — Assessment & Plan Note (Signed)
Chronic, stable off medication. Reviewed diet choices to improve triglyceride levels. The 10-year ASCVD risk score (Arnett DK, et al., 2019) is: 2.9%   Values used to calculate the score:     Age: 47 years     Sex: Female     Is Non-Hispanic African American: No     Diabetic: Yes     Tobacco smoker: No     Systolic Blood Pressure: 120 mmHg     Is BP treated: Yes     HDL Cholesterol: 37 mg/dL     Total Cholesterol: 181 mg/dL

## 2023-05-25 NOTE — Assessment & Plan Note (Signed)
Chronic, great control on mounjaro 7.5mg  - continue.

## 2023-05-25 NOTE — Progress Notes (Signed)
Ph: [phone] Fax: [phone]   Patient ID: Rachel Bailey, female    DOB: 10/17/1975, 47 y.o.   MRN: [id number]  This visit was conducted in person.  BP 120/84   Pulse 76   Temp 97.7 F (36.5 C) (Temporal)   Ht 5' 3.5" (1.613 m)   Wt 177 lb (80.3 kg)   LMP 04/21/2023   SpO2 99%   BMI 30.86 kg/m    CC: CPE Subjective:   HPI: Rachel Bailey is a 47 y.o. female presenting on 05/25/2023 for Annual Exam   DM - continues mounjaro 5mg  weekly with benefit, tolerates well.  Getting Monjaro through [place] pharmacy but may need to transition to local pharmacy.  Notes more regular cycles since May while on Mounjaro 7.5mg .  She's started using Ryze mushroom coffee supplement since 01/2023.   Preventative: Colon cancer screening - discussed options, would like Cologuard  Well woman exam - OBGYN in [facility], normal paps, latest 11/2021. Known PCOS. Recent US returned normal.  LMP - restarting this year with mounjaro Mammogram 05/2023 - Birads2 @ [facility]  DEXA scan - not due  Flu shot - declines  COVID vaccine - Pfizer 11/2019, 12/2019, booster 07/2020  Tdap 2015 Pneumonia shot - not due Shingrix - not due  Advanced directive discussion -  Seat belt use discussed  Sunscreen use discussed. No changing moles on skin. Sleep - averages 7-8 hours of sleep/night Non smoker  Alcohol - occasional glass of wine  Dentist - q6 mo  Eye exam - yearly    Caffeine: 2 cups coffee/day  Lives with husband, son (2005), 2 twins (2012), outdoor Medical laboratory scientific officer  Occupation: Visual merchandiser at [employer]  Edu: BS in business administration  Activity: walking 2x/wk on treadmill for 20 min  Diet: good water, fruits/vegetables daily      Relevant past medical, surgical, family and social history reviewed and updated as indicated. Interim medical history since our last visit reviewed. Allergies and medications reviewed and updated. Outpatient Medications Prior to Visit  Medication Sig Dispense  Refill   MAGNESIUM GLYCINATE PO Take by mouth daily. Takes 3 capsules daily     Multiple Vitamin (MULTIVITAMIN ADULT) TABS Take 1 tablet by mouth daily.     Omega-3 Fatty Acids (FISH OIL) 1000 MG CAPS Take 3 capsules (3,000 mg total) by mouth at bedtime.  0   scopolamine (TRANSDERM-SCOP) 1 MG/3DAYS Place 1 patch (1.5 mg total) onto the skin every 3 (three) days. 10 patch 0   losartan (COZAAR) 50 MG tablet Take 1 tablet (50 mg total) by mouth daily. 90 tablet 3   tirzepatide (MOUNJARO) 5 MG/0.5ML Pen Inject 5 mg into the skin once a week. 6 mL 1   Magnesium Hydroxide 1200 MG CHEW Chew 3 capsules by mouth daily.     No facility-administered medications prior to visit.     Per HPI unless specifically indicated in ROS section below Review of Systems  Constitutional:  Negative for activity change, appetite change, chills, fatigue, fever and unexpected weight change.  HENT:  Negative for hearing loss.   Eyes:  Negative for visual disturbance.  Respiratory:  Negative for cough, chest tightness, shortness of breath and wheezing.   Cardiovascular:  Negative for chest pain, palpitations and leg swelling.  Gastrointestinal:  Negative for abdominal distention, abdominal pain, blood in stool, constipation, diarrhea, nausea and vomiting.  Genitourinary:  Negative for difficulty urinating and hematuria.  Musculoskeletal:  Negative for arthralgias, myalgias and neck pain.  Skin:  Negative for rash.  Neurological:  Negative for dizziness, seizures, syncope and headaches.  Hematological:  Negative for adenopathy. Does not bruise/bleed easily.  Psychiatric/Behavioral:  Negative for dysphoric mood. The patient is not nervous/anxious.     Objective:  BP 120/84   Pulse 76   Temp 97.7 F (36.5 C) (Temporal)   Ht 5' 3.5" (1.613 m)   Wt 177 lb (80.3 kg)   LMP 04/21/2023   SpO2 99%   BMI 30.86 kg/m   Wt Readings from Last 3 Encounters:  05/25/23 177 lb (80.3 kg)  11/19/22 176 lb 6 oz (80 kg)   05/21/22 177 lb 2 oz (80.3 kg)      Physical Exam Vitals and nursing note reviewed.  Constitutional:      Appearance: Normal appearance. She is not ill-appearing.  HENT:     Head: Normocephalic and atraumatic.     Right Ear: Tympanic membrane, ear canal and external ear normal. There is no impacted cerumen.     Left Ear: Tympanic membrane, ear canal and external ear normal. There is no impacted cerumen.     Mouth/Throat:     Mouth: Mucous membranes are moist.     Pharynx: Oropharynx is clear. No oropharyngeal exudate or posterior oropharyngeal erythema.  Eyes:     General:        Right eye: No discharge.        Left eye: No discharge.     Extraocular Movements: Extraocular movements intact.     Conjunctiva/sclera: Conjunctivae normal.     Pupils: Pupils are equal, round, and reactive to light.  Neck:     Thyroid: No thyroid mass or thyromegaly.  Cardiovascular:     Rate and Rhythm: Normal rate and regular rhythm.     Pulses: Normal pulses.     Heart sounds: Normal heart sounds. No murmur heard. Pulmonary:     Effort: Pulmonary effort is normal. No respiratory distress.     Breath sounds: Normal breath sounds. No wheezing, rhonchi or rales.  Abdominal:     General: Bowel sounds are normal. There is no distension.     Palpations: Abdomen is soft. There is no mass.     Tenderness: There is no abdominal tenderness. There is no guarding or rebound.     Hernia: No hernia is present.  Musculoskeletal:     Cervical back: Normal range of motion and neck supple. No rigidity.     Right lower leg: No edema.     Left lower leg: No edema.  Lymphadenopathy:     Cervical: No cervical adenopathy.  Skin:    General: Skin is warm and dry.     Findings: No rash.  Neurological:     General: No focal deficit present.     Mental Status: She is alert. Mental status is at baseline.  Psychiatric:        Mood and Affect: Mood normal.        Behavior: Behavior normal.       Results for  orders placed or performed in visit on 05/18/23  CBC with Differential/Platelet  Result Value Ref Range   WBC 6.7 4.0 - 10.5 K/uL   RBC 4.07 3.87 - 5.11 Mil/uL   Hemoglobin 12.7 12.0 - 15.0 g/dL   HCT 40.9 81.1 - 91.4 %   MCV 93.2 78.0 - 100.0 fl   MCHC 33.4 30.0 - 36.0 g/dL   RDW 78.2 95.6 - 21.3 %   Platelets 241.0 150.0 - 400.0 K/uL  Neutrophils Relative % 54.7 43.0 - 77.0 %   Lymphocytes Relative 38.1 12.0 - 46.0 %   Monocytes Relative 5.8 3.0 - 12.0 %   Eosinophils Relative 0.8 0.0 - 5.0 %   Basophils Relative 0.6 0.0 - 3.0 %   Neutro Abs 3.7 1.4 - 7.7 K/uL   Lymphs Abs 2.6 0.7 - 4.0 K/uL   Monocytes Absolute 0.4 0.1 - 1.0 K/uL   Eosinophils Absolute 0.1 0.0 - 0.7 K/uL   Basophils Absolute 0.0 0.0 - 0.1 K/uL  Comprehensive metabolic panel  Result Value Ref Range   Sodium 134 (L) 135 - 145 mEq/L   Potassium 4.0 3.5 - 5.1 mEq/L   Chloride 102 96 - 112 mEq/L   CO2 26 19 - 32 mEq/L   Glucose, Bld 92 70 - 99 mg/dL   BUN 13 6 - 23 mg/dL   Creatinine, Ser 3.08 0.40 - 1.20 mg/dL   Total Bilirubin 0.6 0.2 - 1.2 mg/dL   Alkaline Phosphatase 42 39 - 117 U/L   AST 12 0 - 37 U/L   ALT 19 0 - 35 U/L   Total Protein 7.3 6.0 - 8.3 g/dL   Albumin 4.2 3.5 - 5.2 g/dL   GFR 657.84 >69.62 mL/min   Calcium 8.9 8.4 - 10.5 mg/dL  Lipid panel  Result Value Ref Range   Cholesterol 181 0 - 200 mg/dL   Triglycerides 952.8 (H) 0.0 - 149.0 mg/dL   HDL 41.32 (L) >44.01 mg/dL   VLDL 02.7 (H) 0.0 - 25.3 mg/dL   LDL Cholesterol 89 0 - 99 mg/dL   Total CHOL/HDL Ratio 5    NonHDL 144.44   Microalbumin / creatinine urine ratio  Result Value Ref Range   Microalb, Ur 0.9 0.0 - 1.9 mg/dL   Creatinine,U 664.4 mg/dL   Microalb Creat Ratio 0.6 0.0 - 30.0 mg/g  Hemoglobin A1c  Result Value Ref Range   Hgb A1c MFr Bld 5.4 4.6 - 6.5 %    Assessment & Plan:   Problem List Items Addressed This Visit     Healthcare maintenance (Chronic)    Preventative protocols reviewed and updated unless pt  declined. Discussed healthy diet and lifestyle.       Obesity, Class I, BMI 30-34.9    Continue to encourage healthy diet and lifestyle choices.       Dyslipidemia    Chronic, stable off medication. Reviewed diet choices to improve triglyceride levels. The 10-year ASCVD risk score (Arnett DK, et al., 2019) is: 2.9%   Values used to calculate the score:     Age: 68 years     Sex: Female     Is Non-Hispanic African American: No     Diabetic: Yes     Tobacco smoker: No     Systolic Blood Pressure: 120 mmHg     Is BP treated: Yes     HDL Cholesterol: 37 mg/dL     Total Cholesterol: 181 mg/dL       PCOS (polycystic ovarian syndrome)    Periods have somewhat normalized since weight loss.       Hypertension    Chronic, stable on losartan - continue.       Relevant Medications   losartan (COZAAR) 50 MG tablet   Type 2 diabetes mellitus with other specified complication (HCC)    Chronic, great control on mounjaro 7.5mg  - continue.       Relevant Medications   tirzepatide (MOUNJARO) 7.5 MG/0.5ML Pen   losartan (COZAAR) 50  MG tablet   Other Visit Diagnoses     Special screening for malignant neoplasms, colon    -  Primary   Relevant Orders   Cologuard        Meds ordered this encounter  Medications   tirzepatide (MOUNJARO) 7.5 MG/0.5ML Pen    Sig: Inject 7.5 mg into the skin once a week.    Dispense:  6 mL    Refill:  3   losartan (COZAAR) 50 MG tablet    Sig: Take 1 tablet (50 mg total) by mouth daily.    Dispense:  90 tablet    Refill:  4    Orders Placed This Encounter  Procedures   Cologuard    Patient Instructions  We will sign you up for Cologuard.  I've refilled Mounjaro at 7.5mg  weekly dose sent to local [facility] pharmacy Good to see you today Return in 6 months for follow up visit   Follow up plan: Return in about 6 months (around 11/22/2023) for follow up visit.  Eustaquio Boyden, MD

## 2023-05-25 NOTE — Assessment & Plan Note (Signed)
Continue to encourage healthy diet and lifestyle choices

## 2023-09-01 ENCOUNTER — Other Ambulatory Visit (HOSPITAL_COMMUNITY): Payer: Self-pay

## 2023-09-01 ENCOUNTER — Telehealth: Payer: Self-pay

## 2023-09-01 NOTE — Telephone Encounter (Signed)
 Pharmacy Patient Advocate Encounter   Received notification from CoverMyMeds that prior authorization for Mounjaro  7.5mg  is required/requested.   Insurance verification completed.   The patient is insured through [employer] .   Per test claim: PA required; PA submitted to above mentioned insurance via CoverMyMeds Key/confirmation #/EOC Key: [id number]   Status is pending

## 2023-09-02 ENCOUNTER — Other Ambulatory Visit (HOSPITAL_COMMUNITY): Payer: Self-pay

## 2023-09-02 NOTE — Telephone Encounter (Signed)
 Pharmacy Patient Advocate Encounter  Received notification from [facility] that Prior Authorization for MOUNJARO   has been APPROVED from 12.3.24 to 1.2.26. Ran test claim, Copay is $25.00. This test claim was processed through [facility]- copay amounts may vary at other pharmacies due to pharmacy/plan contracts, or as the patient moves through the different stages of their insurance plan.

## 2023-11-22 ENCOUNTER — Encounter: Payer: Self-pay | Admitting: Family Medicine

## 2023-11-22 ENCOUNTER — Ambulatory Visit (INDEPENDENT_AMBULATORY_CARE_PROVIDER_SITE_OTHER): Payer: BC Managed Care – PPO | Admitting: Family Medicine

## 2023-11-22 VITALS — BP 128/84 | HR 72 | Temp 98.4°F | Ht 63.5 in | Wt 181.4 lb

## 2023-11-22 DIAGNOSIS — I10 Essential (primary) hypertension: Secondary | ICD-10-CM

## 2023-11-22 DIAGNOSIS — Z7985 Long-term (current) use of injectable non-insulin antidiabetic drugs: Secondary | ICD-10-CM

## 2023-11-22 DIAGNOSIS — E1169 Type 2 diabetes mellitus with other specified complication: Secondary | ICD-10-CM

## 2023-11-22 LAB — POCT GLYCOSYLATED HEMOGLOBIN (HGB A1C): Hemoglobin A1C: 5.3 % (ref 4.0–5.6)

## 2023-11-22 NOTE — Patient Instructions (Signed)
 You are doing well today Continue current regimen of Mounjaro 7.5mg  every 10 days Return as needed or in 6 months for physical.

## 2023-11-22 NOTE — Progress Notes (Signed)
 Ph: [phone] Fax: [phone]   Patient ID: Rachel Bailey, female    DOB: 08-13-1976, 48 y.o.   MRN: [id number]  This visit was conducted in person.  BP 128/84   Pulse 72   Temp 98.4 F (36.9 C) (Oral)   Ht 5' 3.5" (1.613 m)   Wt 181 lb 6 oz (82.3 kg)   LMP 10/18/2023   SpO2 98%   BMI 31.63 kg/m    CC: 6 mo DM f/u visit  Subjective:   HPI: Rachel Bailey is a 48 y.o. female presenting on 11/22/2023 for Medical Management of Chronic Issues (Here for 6 mo DM f/u.)   DM - does not regularly check sugars - overall well controlled. Compliant with antihyperglycemic regimen which includes: mounjaro 7.5mg  Q10 days. Denies low sugars or hypoglycemic symptoms. Denies paresthesias, blurry vision. Last diabetic eye exam 01/2023. Glucometer brand: unsure. Last foot exam: DUE. DSME: has not completed.  Lab Results  Component Value Date   HGBA1C 5.3 11/22/2023   Diabetic Foot Exam - Simple   Simple Foot Form Diabetic Foot exam was performed with the following findings: Yes 11/22/2023  8:15 AM  Visual Inspection No deformities, no ulcerations, no other skin breakdown bilaterally: Yes Sensation Testing Intact to touch and monofilament testing bilaterally: Yes Pulse Check Posterior Tibialis and Dorsalis pulse intact bilaterally: Yes Comments No claudication    Lab Results  Component Value Date   MICROALBUR 0.9 05/18/2023    LMP - current about to start periods. Her cycles are 35-40 days, pretty consistent.     Relevant past medical, surgical, family and social history reviewed and updated as indicated. Interim medical history since our last visit reviewed. Allergies and medications reviewed and updated. Outpatient Medications Prior to Visit  Medication Sig Dispense Refill   losartan (COZAAR) 50 MG tablet Take 1 tablet (50 mg total) by mouth daily. 90 tablet 4   MAGNESIUM GLYCINATE PO Take by mouth daily. Takes 3 capsules daily     Multiple Vitamin (MULTIVITAMIN ADULT) TABS Take 1  tablet by mouth daily.     Omega-3 Fatty Acids (FISH OIL) 1000 MG CAPS Take 3 capsules (3,000 mg total) by mouth at bedtime.  0   tirzepatide (MOUNJARO) 7.5 MG/0.5ML Pen Inject 7.5 mg into the skin once a week. 6 mL 3   scopolamine (TRANSDERM-SCOP) 1 MG/3DAYS Place 1 patch (1.5 mg total) onto the skin every 3 (three) days. 10 patch 0   No facility-administered medications prior to visit.     Per HPI unless specifically indicated in ROS section below Review of Systems  Objective:  BP 128/84   Pulse 72   Temp 98.4 F (36.9 C) (Oral)   Ht 5' 3.5" (1.613 m)   Wt 181 lb 6 oz (82.3 kg)   LMP 10/18/2023   SpO2 98%   BMI 31.63 kg/m   Wt Readings from Last 3 Encounters:  11/22/23 181 lb 6 oz (82.3 kg)  05/25/23 177 lb (80.3 kg)  11/19/22 176 lb 6 oz (80 kg)      Physical Exam Vitals and nursing note reviewed.  Constitutional:      Appearance: Normal appearance. She is not ill-appearing.  HENT:     Head: Normocephalic and atraumatic.     Mouth/Throat:     Mouth: Mucous membranes are moist.     Pharynx: Oropharynx is clear. No oropharyngeal exudate or posterior oropharyngeal erythema.  Eyes:     Extraocular Movements: Extraocular movements intact.     Conjunctiva/sclera:  Conjunctivae normal.     Pupils: Pupils are equal, round, and reactive to light.  Neck:     Thyroid: No thyroid mass, thyromegaly or thyroid tenderness.  Cardiovascular:     Rate and Rhythm: Normal rate and regular rhythm.     Pulses: Normal pulses.     Heart sounds: Normal heart sounds. No murmur heard. Pulmonary:     Effort: Pulmonary effort is normal. No respiratory distress.     Breath sounds: Normal breath sounds. No wheezing, rhonchi or rales.  Abdominal:     General: Bowel sounds are normal. There is no distension.     Palpations: Abdomen is soft. There is no mass.     Tenderness: There is no abdominal tenderness. There is no guarding or rebound.     Hernia: No hernia is present.  Musculoskeletal:      Right lower leg: No edema.     Left lower leg: No edema.  Skin:    General: Skin is warm and dry.     Findings: No rash.  Neurological:     Mental Status: She is alert.  Psychiatric:        Mood and Affect: Mood normal.        Behavior: Behavior normal.       Results for orders placed or performed in visit on 11/22/23  POCT glycosylated hemoglobin (Hb A1C)   Collection Time: 11/22/23  8:16 AM  Result Value Ref Range   Hemoglobin A1C 5.3 4.0 - 5.6 %   HbA1c POC (<> result, manual entry)     HbA1c, POC (prediabetic range)     HbA1c, POC (controlled diabetic range)      Assessment & Plan:   Problem List Items Addressed This Visit     Hypertension   Chronic, stable on losartan - continue.       Type 2 diabetes mellitus with other specified complication (HCC) - Primary   Chronic, well controlled on mounjaro 7.5mg  Q10d - continue this  Declines DSME at this time.  Foot exam today.  RTC 6 mo CPE.       Relevant Orders   POCT glycosylated hemoglobin (Hb A1C) (Completed)     No orders of the defined types were placed in this encounter.   Orders Placed This Encounter  Procedures   POCT glycosylated hemoglobin (Hb A1C)    Patient Instructions  You are doing well today Continue current regimen of Mounjaro 7.5mg  every 10 days Return as needed or in 6 months for physical.   Follow up plan: Return in about 6 months (around 05/24/2024) for annual exam, prior fasting for blood work.  Eustaquio Boyden, MD

## 2023-11-22 NOTE — Assessment & Plan Note (Addendum)
 Chronic, well controlled on mounjaro 7.5mg  Q10d - continue this  Declines DSME at this time.  Foot exam today.  RTC 6 mo CPE.

## 2023-11-22 NOTE — Assessment & Plan Note (Signed)
 Chronic, stable on losartan - continue.

## 2024-02-09 LAB — HM DIABETES EYE EXAM

## 2024-05-20 ENCOUNTER — Other Ambulatory Visit: Payer: Self-pay | Admitting: Family Medicine

## 2024-05-20 DIAGNOSIS — E1169 Type 2 diabetes mellitus with other specified complication: Secondary | ICD-10-CM

## 2024-05-20 DIAGNOSIS — E785 Hyperlipidemia, unspecified: Secondary | ICD-10-CM

## 2024-05-20 DIAGNOSIS — D649 Anemia, unspecified: Secondary | ICD-10-CM

## 2024-05-22 ENCOUNTER — Other Ambulatory Visit

## 2024-05-22 DIAGNOSIS — E785 Hyperlipidemia, unspecified: Secondary | ICD-10-CM | POA: Diagnosis not present

## 2024-05-22 DIAGNOSIS — D649 Anemia, unspecified: Secondary | ICD-10-CM | POA: Diagnosis not present

## 2024-05-22 DIAGNOSIS — E1169 Type 2 diabetes mellitus with other specified complication: Secondary | ICD-10-CM

## 2024-05-22 LAB — COMPREHENSIVE METABOLIC PANEL WITH GFR
ALT: 11 U/L (ref 0–35)
AST: 10 U/L (ref 0–37)
Albumin: 4.2 g/dL (ref 3.5–5.2)
Alkaline Phosphatase: 40 U/L (ref 39–117)
BUN: 13 mg/dL (ref 6–23)
CO2: 28 meq/L (ref 19–32)
Calcium: 9.5 mg/dL (ref 8.4–10.5)
Chloride: 102 meq/L (ref 96–112)
Creatinine, Ser: 0.63 mg/dL (ref 0.40–1.20)
GFR: 106.47 mL/min (ref >60.00)
Glucose, Bld: 92 mg/dL (ref 70–99)
Potassium: 3.9 meq/L (ref 3.5–5.1)
Sodium: 137 meq/L (ref 135–145)
Total Bilirubin: 0.4 mg/dL (ref 0.2–1.2)
Total Protein: 7.4 g/dL (ref 6.0–8.3)

## 2024-05-22 LAB — CBC WITH DIFFERENTIAL/PLATELET
Basophils Absolute: 0 K/uL (ref 0.0–0.1)
Basophils Relative: 0.7 % (ref 0.0–3.0)
Eosinophils Absolute: 0 K/uL (ref 0.0–0.7)
Eosinophils Relative: 0.7 % (ref 0.0–5.0)
HCT: 36.6 % (ref 36.0–46.0)
Hemoglobin: 12.4 g/dL (ref 12.0–15.0)
Lymphocytes Relative: 37.4 % (ref 12.0–46.0)
Lymphs Abs: 2.3 K/uL (ref 0.7–4.0)
MCHC: 34 g/dL (ref 30.0–36.0)
MCV: 92 fl (ref 78.0–100.0)
Monocytes Absolute: 0.3 K/uL (ref 0.1–1.0)
Monocytes Relative: 5.4 % (ref 3.0–12.0)
Neutro Abs: 3.4 K/uL (ref 1.4–7.7)
Neutrophils Relative %: 55.8 % (ref 43.0–77.0)
Platelets: 235 K/uL (ref 150.0–400.0)
RBC: 3.98 Mil/uL (ref 3.87–5.11)
RDW: 12.9 % (ref 11.5–15.5)
WBC: 6.1 K/uL (ref 4.0–10.5)

## 2024-05-22 LAB — LIPID PANEL
Cholesterol: 193 mg/dL (ref 0–200)
HDL: 35.2 mg/dL — ABNORMAL LOW (ref >39.00)
LDL Cholesterol: 91 mg/dL (ref 0–99)
NonHDL: 158.24
Total CHOL/HDL Ratio: 5
Triglycerides: 337 mg/dL — ABNORMAL HIGH (ref 0.0–149.0)
VLDL: 67.4 mg/dL — ABNORMAL HIGH (ref 0.0–40.0)

## 2024-05-22 LAB — HEMOGLOBIN A1C: Hgb A1c MFr Bld: 5.6 % (ref 4.6–6.5)

## 2024-05-22 LAB — VITAMIN B12: Vitamin B-12: 357 pg/mL (ref 211–911)

## 2024-05-22 LAB — MICROALBUMIN / CREATININE URINE RATIO
Creatinine,U: 162.4 mg/dL
Microalb Creat Ratio: 9.9 mg/g (ref 0.0–30.0)
Microalb, Ur: 1.6 mg/dL (ref 0.0–1.9)

## 2024-05-23 ENCOUNTER — Ambulatory Visit: Payer: Self-pay | Admitting: Family Medicine

## 2024-05-29 ENCOUNTER — Encounter: Payer: Self-pay | Admitting: Family Medicine

## 2024-05-29 ENCOUNTER — Ambulatory Visit (INDEPENDENT_AMBULATORY_CARE_PROVIDER_SITE_OTHER): Admitting: Family Medicine

## 2024-05-29 VITALS — BP 120/82 | HR 71 | Temp 98.8°F | Ht 63.5 in | Wt 177.0 lb

## 2024-05-29 DIAGNOSIS — M9905 Segmental and somatic dysfunction of pelvic region: Secondary | ICD-10-CM | POA: Diagnosis not present

## 2024-05-29 DIAGNOSIS — E1169 Type 2 diabetes mellitus with other specified complication: Secondary | ICD-10-CM

## 2024-05-29 DIAGNOSIS — R140 Abdominal distension (gaseous): Secondary | ICD-10-CM | POA: Diagnosis not present

## 2024-05-29 DIAGNOSIS — E785 Hyperlipidemia, unspecified: Secondary | ICD-10-CM

## 2024-05-29 DIAGNOSIS — Z1211 Encounter for screening for malignant neoplasm of colon: Secondary | ICD-10-CM

## 2024-05-29 DIAGNOSIS — M9903 Segmental and somatic dysfunction of lumbar region: Secondary | ICD-10-CM | POA: Diagnosis not present

## 2024-05-29 DIAGNOSIS — Z0000 Encounter for general adult medical examination without abnormal findings: Secondary | ICD-10-CM

## 2024-05-29 DIAGNOSIS — Z7985 Long-term (current) use of injectable non-insulin antidiabetic drugs: Secondary | ICD-10-CM

## 2024-05-29 DIAGNOSIS — M9901 Segmental and somatic dysfunction of cervical region: Secondary | ICD-10-CM | POA: Diagnosis not present

## 2024-05-29 DIAGNOSIS — E66811 Obesity, class 1: Secondary | ICD-10-CM

## 2024-05-29 DIAGNOSIS — Z23 Encounter for immunization: Secondary | ICD-10-CM

## 2024-05-29 DIAGNOSIS — M9902 Segmental and somatic dysfunction of thoracic region: Secondary | ICD-10-CM | POA: Diagnosis not present

## 2024-05-29 DIAGNOSIS — I10 Essential (primary) hypertension: Secondary | ICD-10-CM

## 2024-05-29 MED ORDER — LOSARTAN POTASSIUM 50 MG PO TABS: 50.0000 mg | ORAL_TABLET | Freq: Every day | ORAL | 4 refills | Status: AC

## 2024-05-29 MED ORDER — MAGNESIUM GLYCINATE 100 MG PO CAPS: 3.0000 | ORAL_CAPSULE | Freq: Every day | ORAL | Status: AC

## 2024-05-29 MED ORDER — TIRZEPATIDE 7.5 MG/0.5ML ~~LOC~~ SOAJ: 7.5000 mg | SUBCUTANEOUS | 3 refills | Status: AC

## 2024-05-29 NOTE — Assessment & Plan Note (Addendum)
 Chronic, great control on Mounjaro  7.5mg  q10d - continue this.  RTC 1 yr CPE.  Reviewed importance of regular strength training to maintain muscle mass on Mounjaro .

## 2024-05-29 NOTE — Assessment & Plan Note (Addendum)
 Chronic, triglycerides remain elevated. Reviewed ASCVD risk, recommend triglyceride eating plan, handout provided.  The 10-year ASCVD risk score (Arnett DK, et al., 2019) is: 3.7%   Values used to calculate the score:     Age: 48 years     Clincally relevant sex: Female     Is Non-Hispanic African American: No     Diabetic: Yes     Tobacco smoker: No     Systolic Blood Pressure: 120 mmHg     Is BP treated: Yes     HDL Cholesterol: 35.2 mg/dL     Total Cholesterol: 193 mg/dL

## 2024-05-29 NOTE — Progress Notes (Addendum)
 Ph: [phone] Fax: [phone]   Patient ID: Rachel Bailey, female    DOB: [birth year], 48 y.o.   MRN: [id number]  This visit was conducted in person.  BP 120/82   Pulse 71   Temp 98.8 F (37.1 C) (Oral)   Ht 5' 3.5 (1.613 m)   Wt 177 lb (80.3 kg)   LMP 04/30/2024   SpO2 99%   BMI 30.86 kg/m    CC: CPE Subjective:   HPI: Rachel Bailey is a 48 y.o. female presenting on 05/29/2024 for Annual Exam   DM - stable period on Mounjaro  7.5mg  q10 days, on Mounjaro  since 12/2022. Overall tolerates this well without nausea, thyroid  fullness, epigastric pain. Finds this has normalized her menstrual cycles.  She's also been using Ryze mushroom coffee supplement since 01/2023.  Lab Results  Component Value Date   HGBA1C 5.6 05/22/2024    Abd bloating, indigestion - finds this may have been due to constipation. She's been using miralax, prune juice with benefit.   Preventative: Colon cancer screening - discussed options, would like to retry Cologuard  Well woman exam - OBGYN in [facility], normal paps, last seen 01/2022. Known PCOS. US  returned normal.  LMP - restarting this year with mounjaro  Mammogram 05/2023 - Birads2 @ [facility]. No fmhx breast cancer  DEXA scan - not due  Flu shot - declines  COVID vaccine - Pfizer 11/2019, 12/2019, booster 07/2020  Tdap 2015, update today Pneumonia shot - not due Shingrix - not due  Advanced directive discussion -  Seat belt use discussed  Sunscreen use discussed. No changing moles on skin.  Sleep - averages 7-8 hours of sleep/night Non smoker  Alcohol - occasional glass of wine  Dentist - q6 mo  Eye exam - yearly   Caffeine: 2 cups coffee/day  Lives with husband, son (2005), 2 twins (2012), outdoor Medical laboratory scientific officer  Occupation: Visual merchandiser at [employer]  Edu: BS in business administration  Activity: walking 2x/wk on treadmill for 20 min  Diet: good water, fruits/vegetables daily      Relevant past medical, surgical, family and  social history reviewed and updated as indicated. Interim medical history since our last visit reviewed. Allergies and medications reviewed and updated. Outpatient Medications Prior to Visit  Medication Sig Dispense Refill   Multiple Vitamin (MULTIVITAMIN ADULT) TABS Take 1 tablet by mouth daily.     Omega-3 Fatty Acids (FISH OIL ) 1000 MG CAPS Take 3 capsules (3,000 mg total) by mouth at bedtime.  0   losartan  (COZAAR ) 50 MG tablet Take 1 tablet (50 mg total) by mouth daily. 90 tablet 4   MAGNESIUM  GLYCINATE PO Take by mouth daily. Takes 3 capsules daily     tirzepatide  (MOUNJARO ) 7.5 MG/0.5ML Pen Inject 7.5 mg into the skin once a week. 6 mL 3   No facility-administered medications prior to visit.     Per HPI unless specifically indicated in ROS section below Review of Systems  Constitutional:  Negative for activity change, appetite change, chills, fatigue, fever and unexpected weight change.  HENT:  Negative for hearing loss.   Eyes:  Negative for visual disturbance.  Respiratory:  Positive for cough (occ). Negative for chest tightness, shortness of breath and wheezing.   Cardiovascular:  Negative for chest pain, palpitations and leg swelling.  Gastrointestinal:  Negative for abdominal distention, abdominal pain, blood in stool, constipation, diarrhea, nausea and vomiting.  Genitourinary:  Negative for difficulty urinating and hematuria.  Musculoskeletal:  Negative for arthralgias, myalgias  and neck pain.  Skin:  Negative for rash.  Neurological:  Positive for headaches (occ). Negative for dizziness, seizures and syncope.  Hematological:  Negative for adenopathy. Does not bruise/bleed easily.  Psychiatric/Behavioral:  Negative for dysphoric mood. The patient is not nervous/anxious.     Objective:  BP 120/82   Pulse 71   Temp 98.8 F (37.1 C) (Oral)   Ht 5' 3.5 (1.613 m)   Wt 177 lb (80.3 kg)   LMP 04/30/2024   SpO2 99%   BMI 30.86 kg/m   Wt Readings from Last 3 Encounters:   05/29/24 177 lb (80.3 kg)  11/22/23 181 lb 6 oz (82.3 kg)  05/25/23 177 lb (80.3 kg)      Physical Exam Vitals and nursing note reviewed.  Constitutional:      Appearance: Normal appearance. She is not ill-appearing.  HENT:     Head: Normocephalic and atraumatic.     Right Ear: Tympanic membrane, ear canal and external ear normal. There is no impacted cerumen.     Left Ear: Tympanic membrane, ear canal and external ear normal. There is no impacted cerumen.     Mouth/Throat:     Mouth: Mucous membranes are moist.     Pharynx: Oropharynx is clear. No oropharyngeal exudate or posterior oropharyngeal erythema.  Eyes:     General:        Right eye: No discharge.        Left eye: No discharge.     Extraocular Movements: Extraocular movements intact.     Conjunctiva/sclera: Conjunctivae normal.     Pupils: Pupils are equal, round, and reactive to light.  Neck:     Thyroid : No thyroid  mass or thyromegaly.  Cardiovascular:     Rate and Rhythm: Normal rate and regular rhythm.     Pulses: Normal pulses.     Heart sounds: Normal heart sounds. No murmur heard. Pulmonary:     Effort: Pulmonary effort is normal. No respiratory distress.     Breath sounds: Normal breath sounds. No wheezing, rhonchi or rales.  Abdominal:     General: Bowel sounds are normal. There is no distension.     Palpations: Abdomen is soft. There is no mass.     Tenderness: There is no abdominal tenderness. There is no guarding or rebound.     Hernia: No hernia is present.  Musculoskeletal:     Cervical back: Normal range of motion and neck supple. No rigidity.     Right lower leg: No edema.     Left lower leg: No edema.  Lymphadenopathy:     Cervical: No cervical adenopathy.  Skin:    General: Skin is warm and dry.     Findings: No rash.  Neurological:     General: No focal deficit present.     Mental Status: She is alert. Mental status is at baseline.  Psychiatric:        Mood and Affect: Mood normal.         Behavior: Behavior normal.       Results for orders placed or performed in visit on 05/22/24  Vitamin B12   Collection Time: 05/22/24  7:41 AM  Result Value Ref Range   Vitamin B-12 357 211 - 911 pg/mL  CBC with Differential/Platelet   Collection Time: 05/22/24  7:41 AM  Result Value Ref Range   WBC 6.1 4.0 - 10.5 K/uL   RBC 3.98 3.87 - 5.11 Mil/uL   Hemoglobin 12.4 12.0 - 15.0 g/dL  HCT 36.6 36.0 - 46.0 %   MCV 92.0 78.0 - 100.0 fl   MCHC 34.0 30.0 - 36.0 g/dL   RDW 87.0 88.4 - 84.4 %   Platelets 235.0 150.0 - 400.0 K/uL   Neutrophils Relative % 55.8 43.0 - 77.0 %   Lymphocytes Relative 37.4 12.0 - 46.0 %   Monocytes Relative 5.4 3.0 - 12.0 %   Eosinophils Relative 0.7 0.0 - 5.0 %   Basophils Relative 0.7 0.0 - 3.0 %   Neutro Abs 3.4 1.4 - 7.7 K/uL   Lymphs Abs 2.3 0.7 - 4.0 K/uL   Monocytes Absolute 0.3 0.1 - 1.0 K/uL   Eosinophils Absolute 0.0 0.0 - 0.7 K/uL   Basophils Absolute 0.0 0.0 - 0.1 K/uL  Comprehensive metabolic panel with GFR   Collection Time: 05/22/24  7:41 AM  Result Value Ref Range   Sodium 137 135 - 145 mEq/L   Potassium 3.9 3.5 - 5.1 mEq/L   Chloride 102 96 - 112 mEq/L   CO2 28 19 - 32 mEq/L   Glucose, Bld 92 70 - 99 mg/dL   BUN 13 6 - 23 mg/dL   Creatinine, Ser 9.36 0.40 - 1.20 mg/dL   Total Bilirubin 0.4 0.2 - 1.2 mg/dL   Alkaline Phosphatase 40 39 - 117 U/L   AST 10 0 - 37 U/L   ALT 11 0 - 35 U/L   Total Protein 7.4 6.0 - 8.3 g/dL   Albumin 4.2 3.5 - 5.2 g/dL   GFR 893.52 >39.99 mL/min   Calcium 9.5 8.4 - 10.5 mg/dL  Lipid panel   Collection Time: 05/22/24  7:41 AM  Result Value Ref Range   Cholesterol 193 0 - 200 mg/dL   Triglycerides 662.9 (H) 0.0 - 149.0 mg/dL   HDL 64.79 (L) >60.99 mg/dL   VLDL 32.5 (H) 0.0 - 59.9 mg/dL   LDL Cholesterol 91 0 - 99 mg/dL   Total CHOL/HDL Ratio 5    NonHDL 158.24   Microalbumin / creatinine urine ratio   Collection Time: 05/22/24  7:41 AM  Result Value Ref Range   Microalb, Ur 1.6 0.0 - 1.9  mg/dL   Creatinine,U 837.5 mg/dL   Microalb Creat Ratio 9.9 0.0 - 30.0 mg/g  Hemoglobin A1c   Collection Time: 05/22/24  7:41 AM  Result Value Ref Range   Hgb A1c MFr Bld 5.6 4.6 - 6.5 %    Assessment & Plan:   Problem List Items Addressed This Visit     Healthcare maintenance - Primary (Chronic)   Preventative protocols reviewed and updated unless pt declined. Discussed healthy diet and lifestyle.  Over due for colon cancer screening - did not complete cologuard but agrees to rpt this year.       Obesity, Class I, BMI 30-34.9   Continue to encourage healthy diet and lifestyle choices.  Continue Mounjaro  7.5mg  q10d      Dyslipidemia   Chronic, triglycerides remain elevated. Reviewed ASCVD risk, recommend triglyceride eating plan, handout provided.  The 10-year ASCVD risk score (Arnett DK, et al., 2019) is: 3.7%   Values used to calculate the score:     Age: 42 years     Clincally relevant sex: Female     Is Non-Hispanic African American: No     Diabetic: Yes     Tobacco smoker: No     Systolic Blood Pressure: 120 mmHg     Is BP treated: Yes     HDL Cholesterol: 35.2 mg/dL  Total Cholesterol: 193 mg/dL       Relevant Orders   Lipid panel   Comprehensive metabolic panel with GFR   Hypertension   Chronic, stable on losartan .       Relevant Medications   losartan  (COZAAR ) 50 MG tablet   Type 2 diabetes mellitus with other specified complication (HCC)   Chronic, great control on Mounjaro  7.5mg  q10d - continue this.  RTC 1 yr CPE.  Reviewed importance of regular strength training to maintain muscle mass on Mounjaro .       Relevant Medications   tirzepatide  (MOUNJARO ) 7.5 MG/0.5ML Pen   losartan  (COZAAR ) 50 MG tablet   Other Relevant Orders   Hemoglobin A1c   Abdominal bloating   Attributes symptoms due to constipation.  Managing with miralax, prune juice, Ryze mushroom supplement.  Discussed fiber intake.       Other Visit Diagnoses       Special  screening for malignant neoplasms, colon       Relevant Orders   Cologuard     Long-term (current) use of injectable non-insulin antidiabetic drugs         Need for Tdap vaccination       Relevant Orders   Tdap vaccine greater than or equal to 7yo IM (Completed)        Meds ordered this encounter  Medications   tirzepatide  (MOUNJARO ) 7.5 MG/0.5ML Pen    Sig: Inject 7.5 mg into the skin once a week.    Dispense:  6 mL    Refill:  3   losartan  (COZAAR ) 50 MG tablet    Sig: Take 1 tablet (50 mg total) by mouth daily.    Dispense:  90 tablet    Refill:  4   Magnesium  Glycinate 100 MG CAPS    Sig: Take 3 capsules by mouth daily.    Orders Placed This Encounter  Procedures   Tdap vaccine greater than or equal to 7yo IM   Cologuard   Lipid panel    Standing Status:   Future    Expiration Date:   05/29/2025   Comprehensive metabolic panel with GFR    Standing Status:   Future    Expiration Date:   05/29/2025   Hemoglobin A1c    Standing Status:   Future    Expiration Date:   05/29/2025    Patient Instructions  Tdap today (tetanus and whooping cough) You are doing well today! Cholesterol levels were elevated today especially triglyceride levels - continue working on low cholesterol specifically low triglyceride diet. See handout provided today.  Return as needed or in 1 year for next physical.  Return in 6 months for lab visit only ([facility]).   Follow up plan: Return in about 1 year (around 05/29/2025) for annual exam, prior fasting for blood work.  Anton Blas, MD

## 2024-05-29 NOTE — Assessment & Plan Note (Addendum)
 Preventative protocols reviewed and updated unless pt declined. Discussed healthy diet and lifestyle.  Over due for colon cancer screening - did not complete cologuard but agrees to rpt this year.

## 2024-05-29 NOTE — Assessment & Plan Note (Signed)
Chronic, stable on losartan.  

## 2024-05-29 NOTE — Assessment & Plan Note (Signed)
 Attributes symptoms due to constipation.  Managing with miralax, prune juice, Ryze mushroom supplement.  Discussed fiber intake.

## 2024-05-29 NOTE — Assessment & Plan Note (Signed)
 Continue to encourage healthy diet and lifestyle choices.  Continue Mounjaro  7.5mg  q10d

## 2024-05-29 NOTE — Patient Instructions (Addendum)
 Tdap today (tetanus and whooping cough) You are doing well today! Cholesterol levels were elevated today especially triglyceride levels - continue working on low cholesterol specifically low triglyceride diet. See handout provided today.  Return as needed or in 1 year for next physical.  Return in 6 months for lab visit only ([facility]).

## 2024-06-12 DIAGNOSIS — M9901 Segmental and somatic dysfunction of cervical region: Secondary | ICD-10-CM | POA: Diagnosis not present

## 2024-06-12 DIAGNOSIS — M9902 Segmental and somatic dysfunction of thoracic region: Secondary | ICD-10-CM | POA: Diagnosis not present

## 2024-06-12 DIAGNOSIS — M9903 Segmental and somatic dysfunction of lumbar region: Secondary | ICD-10-CM | POA: Diagnosis not present

## 2024-06-12 DIAGNOSIS — M9905 Segmental and somatic dysfunction of pelvic region: Secondary | ICD-10-CM | POA: Diagnosis not present

## 2024-06-20 DIAGNOSIS — G4719 Other hypersomnia: Secondary | ICD-10-CM | POA: Diagnosis not present

## 2024-06-21 DIAGNOSIS — M9902 Segmental and somatic dysfunction of thoracic region: Secondary | ICD-10-CM | POA: Diagnosis not present

## 2024-06-21 DIAGNOSIS — M9901 Segmental and somatic dysfunction of cervical region: Secondary | ICD-10-CM | POA: Diagnosis not present

## 2024-06-21 DIAGNOSIS — M9903 Segmental and somatic dysfunction of lumbar region: Secondary | ICD-10-CM | POA: Diagnosis not present

## 2024-06-21 DIAGNOSIS — M9905 Segmental and somatic dysfunction of pelvic region: Secondary | ICD-10-CM | POA: Diagnosis not present

## 2024-06-21 DIAGNOSIS — G4719 Other hypersomnia: Secondary | ICD-10-CM | POA: Diagnosis not present

## 2024-06-26 DIAGNOSIS — M9905 Segmental and somatic dysfunction of pelvic region: Secondary | ICD-10-CM | POA: Diagnosis not present

## 2024-06-26 DIAGNOSIS — M9901 Segmental and somatic dysfunction of cervical region: Secondary | ICD-10-CM | POA: Diagnosis not present

## 2024-06-26 DIAGNOSIS — M9902 Segmental and somatic dysfunction of thoracic region: Secondary | ICD-10-CM | POA: Diagnosis not present

## 2024-06-26 DIAGNOSIS — M9903 Segmental and somatic dysfunction of lumbar region: Secondary | ICD-10-CM | POA: Diagnosis not present

## 2024-07-03 DIAGNOSIS — M9902 Segmental and somatic dysfunction of thoracic region: Secondary | ICD-10-CM | POA: Diagnosis not present

## 2024-07-03 DIAGNOSIS — M9901 Segmental and somatic dysfunction of cervical region: Secondary | ICD-10-CM | POA: Diagnosis not present

## 2024-07-03 DIAGNOSIS — M9905 Segmental and somatic dysfunction of pelvic region: Secondary | ICD-10-CM | POA: Diagnosis not present

## 2024-07-17 DIAGNOSIS — M9902 Segmental and somatic dysfunction of thoracic region: Secondary | ICD-10-CM | POA: Diagnosis not present

## 2024-07-17 DIAGNOSIS — M9905 Segmental and somatic dysfunction of pelvic region: Secondary | ICD-10-CM | POA: Diagnosis not present

## 2024-07-17 DIAGNOSIS — M9901 Segmental and somatic dysfunction of cervical region: Secondary | ICD-10-CM | POA: Diagnosis not present

## 2024-08-01 DIAGNOSIS — G4733 Obstructive sleep apnea (adult) (pediatric): Secondary | ICD-10-CM | POA: Diagnosis not present

## 2024-08-08 ENCOUNTER — Other Ambulatory Visit (HOSPITAL_COMMUNITY): Payer: Self-pay

## 2024-08-08 ENCOUNTER — Telehealth: Payer: Self-pay

## 2024-08-08 NOTE — Telephone Encounter (Signed)
 Pharmacy Patient Advocate Encounter   Received notification from Onbase that prior authorization for Mounjaro  7.5 is due for renewal.   Insurance verification completed.   The patient is insured through [employer].  Action: PA required; PA started via CoverMyMeds. KEY [id number] . Waiting for clinical questions to populate.

## 2025-05-24 ENCOUNTER — Other Ambulatory Visit

## 2025-05-31 ENCOUNTER — Encounter: Admitting: Family Medicine
